# Patient Record
Sex: Male | Born: 1990 | Race: White | Hispanic: No | Marital: Single | Smoking: Never smoker
Health system: Southern US, Community
[De-identification: ages and names within clinical notes are randomized; demographics above are authoritative.]

## PROBLEM LIST (undated history)

## (undated) DIAGNOSIS — K5090 Crohn's disease, unspecified, without complications: Secondary | ICD-10-CM

## (undated) DIAGNOSIS — J45909 Unspecified asthma, uncomplicated: Secondary | ICD-10-CM

## (undated) HISTORY — DX: Crohn's disease, unspecified, without complications: K50.90

## (undated) HISTORY — PX: TONSILLECTOMY: SUR1361

---
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
## 2018-11-08 ENCOUNTER — Other Ambulatory Visit: Payer: Self-pay

## 2018-11-08 DIAGNOSIS — Z20822 Contact with and (suspected) exposure to covid-19: Secondary | ICD-10-CM

## 2018-11-09 LAB — NOVEL CORONAVIRUS, NAA: SARS-CoV-2, NAA: NOT DETECTED

## 2019-07-10 ENCOUNTER — Ambulatory Visit: Payer: Self-pay | Admitting: Orthopedic Surgery

## 2019-07-24 ENCOUNTER — Ambulatory Visit: Payer: Self-pay | Admitting: Orthopedic Surgery

## 2019-07-24 NOTE — H&P (Signed)
Subjective:   Roger [name] is a pleasant 29 year old male who was in his normal state of health until October began experiencing severe low back pain, eventually in addition to the severe low back pain, the patient also developed severe do debilitating radicular right leg pain despite conservative treatment including injection therapy, the patient continues to have significant pain interfering with his quality-of-life and he would like to move forward with surgical intervention. He denies any leg weakness or loss of bladder or bowel control. He is scheduled for Right L5-S1 Discectomy on 07/30/19 With Dr. Rolena Infante at [facility].  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 [employer] Chewing tobacco: none Alcohol intake: Occasional Hand Dominance: Right Work related injury?: N Advance directive: N Freight forwarder of Attorney: N Live alone or with others?: with others Marital status: Single  Surgical History Reviewed Surgical History Tonsillectomy  Reviewed Past Medical History ADD/ADHD: Y  Review of Systems As stated in HPI  Objective:   Vitals: Ht: 6 ft 2 in 07/24/2019 01:23 pm Wt: 295 lbs 07/24/2019 01:23 pm BMI: 37.9 07/24/2019 01:23 pm BP: 138/86 07/24/2019 01:30 pm Pulse: 67 bpm 07/24/2019 01:30 pm T: 98.3 F 07/24/2019 01:30 pm Pain Scale: 6 07/24/2019 01:28 pm  Clinical exam: Roger [name] is a pleasant individual, who appears younger than their stated age. He is alert and orientated 3. No shortness of breath, chest pain.  Heart: Regular rate and rhythm, no rubs, murmurs, or gallops  Lungs: Clear auscultation bilaterally  Abdomen is soft and non-tender, negative loss of bowel and  bladder control, no rebound tenderness. Bowel sounds 4.  Negative: skin lesions abrasions contusions  Peripheral pulses: 2+ dorsalis pedis/posterior tibialis pulses bilaterally. Compartment soft and nontender.  Gait pattern: Normal  Assistive devices: None  Neuro: 5/5 motor strength bilaterally in the lower extremity. Positive numbness and dysesthesias in the right S1 dermatome. Positive straight leg raise test on the right side, positive contralateral straight leg raise test. Negative Babinski test, 1+ deep tendon reflexes at the knee and Achilles. No clonus.  Musculoskeletal: Significant back pain with forward flexion. No severe pain with extension. No SI joint pain. No obvious deformity of the lumbar spine is noted on exam. No significant hip, knee, ankle pain with isolated joint range of motion.  Lumbar x-rays taken 06/15/19 no spondylolisthesis or scoliosis is noted. No significant collapse of the disc spaces is noted. No fracture seen.  Lumbar MRI: completed on 06/15/19 was reviewed with the patient. It was completed at [facility]; I have independently reviewed the images as well as the radiology report. No disc herniation or stenosis L1-5. No abnormal marrow signal change, no fracture. Mild disc degeneration at L5-S1 with moderate disc herniation with displacement of the descending S1 nerve root on the right side.  Noted temporary improvement in his right radicular leg pain following the right S1 selective nerve root block. However his overall quality-of-life hiss deteriorated. His clinical exam is essentially unchanged from 06/19/19.  Assessment:    Roger [name] is a very pleasant 29 year old gentleman with a 6 month history of significant back and radicular leg pain. Imaging studies confirm an L5-S1 right-sided disc herniation with right S1 neural compression. Despite conservative treatment consisting of a recent selective nerve root block and self-directed physical therapy his  quality-of-life his  continue to deteriorate. Clinical exam is consistent with lumbar radiculopathy with sensory deficits and positive nerve root tension signs.    Plan:   Patient is expressed a desire to move forward with a lumbar discectomy which I think is reasonable. We have gone over the surgical risks and benefits and all of his questions were encouraged and addressed.Risks and benefits of decompression/discectomy: Infection, bleeding, death, stroke, paralysis, ongoing or worse pain, need for additional surgery, leak of spinal fluid, adjacent segment degeneration requiring additional surgery, post-operative hematoma formation that can result in neurological compromise and the need for urgent/emergent re-operation. Loss in bowel and bladder control. Injury to major vessels that could result in the need for urgent abdominal surgery to stop bleeding. Risk of deep venous thrombosis (DVT) and the need for additional treatment. Recurrent disc herniation resulting in the need for revision surgery, which could include fusion surgery (utilizing instrumentation such as pedicle screws and intervertebral cages).  Additional risk: If instrumentation is used there is a risk of migration, or breakage of that hardware that could require additional surgery.  Goals of surgery: Reduction in pain, and improvement in quality of life.  We have also discussed the post-operative recovery period to include: bathing/showering restrictions, wound healing, activity (and driving) restrictions, medications/pain mangement.  We have also discussed post-operative redflags to include: signs and symptoms of postoperative infection, DVT/PE.  Patient is on the blood thinners or aspirin. He is not taking any over-the-counter medications.  Patient will be fitted for his LSO brace by PT today.  All patients questions were invited and answered.  Follow-up: 2 weeks postoperatively

## 2019-07-24 NOTE — H&P (Deleted)
  The note originally documented on this encounter has been moved the the encounter in which it belongs.  

## 2019-07-24 NOTE — Pre-Procedure Instructions (Signed)
[id number] [place], [place] - [address] [place] [address] Phone: [phone] Fax: [phone]      Your procedure is scheduled on Thursday April 22nd.  Report to [facility] Main Entrance "A" at 5:30 A.M., and check in at the Admitting office.  Call this number if you have problems the morning of surgery:  [phone]  Call [phone] if you have any questions prior to your surgery date Monday-Friday 8am-4pm    Remember:  Do not eat or drink after midnight the night before your surgery   Take these medicines the morning of surgery with A SIP OF WATER   NONE  As of today, STOP taking any Aspirin (unless otherwise instructed by your surgeon) and Aspirin containing products, Aleve, Naproxen, Ibuprofen, Motrin, Advil, Goody's, BC's, all herbal medications, fish oil, and all vitamins.                      Do not wear jewelry            Do not wear lotions, powders, colognes, or deodorant.            Do not shave 48 hours prior to surgery.  Men may shave face and neck.            Do not bring valuables to the hospital.            [facility] is not responsible for any belongings or valuables.  Do NOT Smoke (Tobacco/Vapping) or drink Alcohol 24 hours prior to your procedure If you use a CPAP at night, you may bring all equipment for your overnight stay.   Contacts, glasses, dentures or bridgework may not be worn into surgery.      For patients admitted to the hospital, discharge time will be determined by your treatment team.   Patients discharged the day of surgery will not be allowed to drive home, and someone needs to stay with them for 24 hours.    Special instructions:   [place]- Preparing For Surgery  Before surgery, you can play an important role. Because skin is not sterile, your skin needs to be as free of germs as possible. You can reduce the number of germs on your skin by washing with CHG  (chlorahexidine gluconate) Soap before surgery.  CHG is an antiseptic cleaner which kills germs and bonds with the skin to continue killing germs even after washing.    Oral Hygiene is also important to reduce your risk of infection.  Remember - BRUSH YOUR TEETH THE MORNING OF SURGERY WITH YOUR REGULAR TOOTHPASTE  Please do not use if you have an allergy to CHG or antibacterial soaps. If your skin becomes reddened/irritated stop using the CHG.  Do not shave (including legs and underarms) for at least 48 hours prior to first CHG shower. It is OK to shave your face.  Please follow these instructions carefully.   1. Shower the NIGHT BEFORE SURGERY and the MORNING OF SURGERY with CHG Soap.   2. If you chose to wash your hair, wash your hair first as usual with your normal shampoo.  3. After you shampoo, rinse your hair and body thoroughly to remove the shampoo.  4. Use CHG as you would any other liquid soap. You can apply CHG directly to the skin and wash gently with a scrungie or a clean washcloth.   5. Apply the CHG Soap to your  body ONLY FROM THE NECK DOWN.  Do not use on open wounds or open sores. Avoid contact with your eyes, ears, mouth and genitals (private parts). Wash Face and genitals (private parts)  with your normal soap.   6. Wash thoroughly, paying special attention to the area where your surgery will be performed.  7. Thoroughly rinse your body with warm water from the neck down.  8. DO NOT shower/wash with your normal soap after using and rinsing off the CHG Soap.  9. Pat yourself dry with a CLEAN TOWEL.  10. Wear CLEAN PAJAMAS to bed the night before surgery, wear comfortable clothes the morning of surgery  11. Place CLEAN SHEETS on your bed the night of your first shower and DO NOT SLEEP WITH PETS.   Day of Surgery:   Do not apply any deodorants/lotions.  Please wear clean clothes to the hospital/surgery center.   Remember to brush your teeth WITH YOUR REGULAR  TOOTHPASTE.   Please read over the following fact sheets that you were given.

## 2019-07-27 ENCOUNTER — Other Ambulatory Visit (HOSPITAL_COMMUNITY)
Admission: RE | Admit: 2019-07-27 | Discharge: 2019-07-27 | Disposition: A | Discharge status: Home / Self Care | Payer: 59 | Source: Ambulatory Visit | Attending: Orthopedic Surgery | Admitting: Orthopedic Surgery

## 2019-07-27 ENCOUNTER — Ambulatory Visit (HOSPITAL_COMMUNITY)
Admission: RE | Admit: 2019-07-27 | Discharge: 2019-07-27 | Disposition: A | Discharge status: Home / Self Care | Payer: 59 | Source: Ambulatory Visit | Attending: Orthopedic Surgery | Admitting: Orthopedic Surgery

## 2019-07-27 ENCOUNTER — Encounter (HOSPITAL_COMMUNITY): Payer: Self-pay

## 2019-07-27 ENCOUNTER — Other Ambulatory Visit: Payer: Self-pay

## 2019-07-27 ENCOUNTER — Encounter (HOSPITAL_COMMUNITY)
Admission: RE | Admit: 2019-07-27 | Discharge: 2019-07-27 | Disposition: A | Discharge status: Home / Self Care | Payer: 59 | Source: Ambulatory Visit | Attending: Orthopedic Surgery | Admitting: Orthopedic Surgery

## 2019-07-27 DIAGNOSIS — Z20822 Contact with and (suspected) exposure to covid-19: Secondary | ICD-10-CM | POA: Diagnosis not present

## 2019-07-27 DIAGNOSIS — Z01818 Encounter for other preprocedural examination: Secondary | ICD-10-CM

## 2019-07-27 HISTORY — DX: Unspecified asthma, uncomplicated: J45.909

## 2019-07-27 LAB — CBC
HCT: 44.1 % (ref 39.0–52.0)
Hemoglobin: 14.2 g/dL (ref 13.0–17.0)
MCH: 25.2 pg — ABNORMAL LOW (ref 26.0–34.0)
MCHC: 32.2 g/dL (ref 30.0–36.0)
MCV: 78.2 fL — ABNORMAL LOW (ref 80.0–100.0)
Platelets: 262 10*3/uL (ref 150–400)
RBC: 5.64 MIL/uL (ref 4.22–5.81)
RDW: 13.2 % (ref 11.5–15.5)
WBC: 7.3 10*3/uL (ref 4.0–10.5)
nRBC: 0 % (ref 0.0–0.2)

## 2019-07-27 LAB — URINALYSIS, ROUTINE W REFLEX MICROSCOPIC
Bilirubin Urine: NEGATIVE
Glucose, UA: NEGATIVE mg/dL
Hgb urine dipstick: NEGATIVE
Ketones, ur: NEGATIVE mg/dL
Leukocytes,Ua: NEGATIVE
Nitrite: NEGATIVE
Protein, ur: NEGATIVE mg/dL
Specific Gravity, Urine: 1.023 (ref 1.005–1.030)
pH: 5 (ref 5.0–8.0)

## 2019-07-27 LAB — BASIC METABOLIC PANEL
Anion gap: 11 (ref 5–15)
BUN: 14 mg/dL (ref 6–20)
CO2: 24 mmol/L (ref 22–32)
Calcium: 9.2 mg/dL (ref 8.9–10.3)
Chloride: 104 mmol/L (ref 98–111)
Creatinine, Ser: 0.89 mg/dL (ref 0.61–1.24)
GFR calc Af Amer: >60 mL/min (ref >60)
GFR calc non Af Amer: >60 mL/min (ref >60)
Glucose, Bld: 100 mg/dL — ABNORMAL HIGH (ref 70–99)
Potassium: 4.2 mmol/L (ref 3.5–5.1)
Sodium: 139 mmol/L (ref 135–145)

## 2019-07-27 LAB — APTT: aPTT: 33 seconds (ref 24–36)

## 2019-07-27 LAB — SURGICAL PCR SCREEN
MRSA, PCR: NEGATIVE
Staphylococcus aureus: POSITIVE — AB

## 2019-07-27 LAB — SARS CORONAVIRUS 2 (TAT 6-24 HRS): SARS Coronavirus 2: NEGATIVE

## 2019-07-27 LAB — PROTIME-INR
INR: 1 (ref 0.8–1.2)
Prothrombin Time: 12.8 seconds (ref 11.4–15.2)

## 2019-07-27 NOTE — Progress Notes (Signed)
PCP - denies Cardiologist -denies   PPM/ICD - denies  Chest x-ray - 07/27/2019 EKG - N/A Stress Test - denies ECHO - denies  Cardiac Cath - denies  Sleep Study - denies CPAP - N/A OSA Stop Bang Tool score: 6   DM: denies  Blood Thinner Instructions: N/A Aspirin Instructions: N/A  ERAS Protcol - No  COVID TEST- Scheduled for today 4/19/201 after PAT appointment. Patient verbalized understanding of self-quarantine instructions, appointment time and place.  Anesthesia review: YES, per MD orders  Patient denies shortness of breath, fever, cough and chest pain at PAT appointment  All instructions explained to the patient, with a verbal understanding of the material. Patient agrees to go over the instructions while at home for a better understanding. Patient also instructed to self quarantine after being tested for COVID-19. The opportunity to ask questions was provided.

## 2019-07-29 NOTE — Anesthesia Preprocedure Evaluation (Addendum)
Anesthesia Evaluation  Patient identified by MRN, date of birth, ID band Patient awake    Reviewed: Allergy & Precautions, H&P , NPO status , Patient's Chart, lab work & pertinent test results  Airway Mallampati: II  TM Distance: >3 FB Neck ROM: Full    Dental no notable dental hx. (+) Teeth Intact, Dental Advisory Given   Pulmonary neg pulmonary ROS,    Pulmonary exam normal breath sounds clear to auscultation       Cardiovascular Exercise Tolerance: Good negative cardio ROS Normal cardiovascular exam Rhythm:Regular Rate:Normal     Neuro/Psych negative neurological ROS  negative psych ROS   GI/Hepatic negative GI ROS, Neg liver ROS,   Endo/Other  negative endocrine ROS  Renal/GU negative Renal ROS  negative genitourinary   Musculoskeletal negative musculoskeletal ROS (+)   Abdominal   Peds negative pediatric ROS (+)  Hematology negative hematology ROS (+)   Anesthesia Other Findings   Reproductive/Obstetrics negative OB ROS                            Anesthesia Physical Anesthesia Plan  ASA: II  Anesthesia Plan: General   Post-op Pain Management:    Induction: Intravenous  PONV Risk Score and Plan: 2 and Ondansetron and Dexamethasone  Airway Management Planned: Oral ETT  Additional Equipment:   Intra-op Plan:   Post-operative Plan: Extubation in OR  Informed Consent: I have reviewed the patients History and Physical, chart, labs and discussed the procedure including the risks, benefits and alternatives for the proposed anesthesia with the patient or authorized representative who has indicated his/her understanding and acceptance.       Plan Discussed with: Anesthesiologist, CRNA and Surgeon  Anesthesia Plan Comments: (  )       Anesthesia Quick Evaluation

## 2019-07-30 ENCOUNTER — Ambulatory Visit (HOSPITAL_COMMUNITY): Payer: 59

## 2019-07-30 ENCOUNTER — Ambulatory Visit (HOSPITAL_COMMUNITY)
Admission: RE | Admit: 2019-07-30 | Discharge: 2019-07-30 | Disposition: A | Discharge status: Home / Self Care | Payer: 59 | Attending: Orthopedic Surgery | Admitting: Orthopedic Surgery

## 2019-07-30 ENCOUNTER — Encounter (HOSPITAL_COMMUNITY): Payer: Self-pay | Admitting: Orthopedic Surgery

## 2019-07-30 ENCOUNTER — Ambulatory Visit (HOSPITAL_COMMUNITY): Payer: 59 | Admitting: Physician Assistant

## 2019-07-30 ENCOUNTER — Other Ambulatory Visit: Payer: Self-pay

## 2019-07-30 ENCOUNTER — Ambulatory Visit (HOSPITAL_COMMUNITY): Payer: 59 | Admitting: Anesthesiology

## 2019-07-30 ENCOUNTER — Encounter (HOSPITAL_COMMUNITY)
Admission: RE | Disposition: A | Discharge status: Home / Self Care | Payer: Self-pay | Source: Home / Self Care | Attending: Orthopedic Surgery

## 2019-07-30 DIAGNOSIS — Z419 Encounter for procedure for purposes other than remedying health state, unspecified: Secondary | ICD-10-CM

## 2019-07-30 DIAGNOSIS — M5117 Intervertebral disc disorders with radiculopathy, lumbosacral region: Secondary | ICD-10-CM | POA: Diagnosis present

## 2019-07-30 HISTORY — PX: LUMBAR LAMINECTOMY/DECOMPRESSION MICRODISCECTOMY: SHX5026

## 2019-07-30 SURGERY — LUMBAR LAMINECTOMY/DECOMPRESSION MICRODISCECTOMY 1 LEVEL
Anesthesia: General | Laterality: Right

## 2019-07-30 MED ORDER — LIDOCAINE 2% (20 MG/ML) 5 ML SYRINGE
INTRAMUSCULAR | Status: AC
  Filled 2019-07-30: qty 5

## 2019-07-30 MED ORDER — ROCURONIUM BROMIDE 10 MG/ML (PF) SYRINGE
PREFILLED_SYRINGE | INTRAVENOUS | Status: AC
  Filled 2019-07-30: qty 10

## 2019-07-30 MED ORDER — VANCOMYCIN HCL 500 MG IV SOLR
INTRAVENOUS | Status: AC
  Filled 2019-07-30: qty 500

## 2019-07-30 MED ORDER — ONDANSETRON HCL 4 MG/2ML IJ SOLN
INTRAMUSCULAR | Status: AC
  Filled 2019-07-30: qty 2

## 2019-07-30 MED ORDER — THROMBIN 20000 UNITS EX SOLR: CUTANEOUS | Status: DC | PRN

## 2019-07-30 MED ORDER — FENTANYL CITRATE (PF) 100 MCG/2ML IJ SOLN
INTRAMUSCULAR | Status: AC
  Filled 2019-07-30: qty 2

## 2019-07-30 MED ORDER — FENTANYL CITRATE (PF) 100 MCG/2ML IJ SOLN
25.0000 ug | INTRAMUSCULAR | Status: DC | PRN
  Administered 2019-07-30 (×2): 50 ug via INTRAVENOUS

## 2019-07-30 MED ORDER — DEXAMETHASONE SODIUM PHOSPHATE 10 MG/ML IJ SOLN
INTRAMUSCULAR | Status: AC
  Filled 2019-07-30: qty 1

## 2019-07-30 MED ORDER — OXYCODONE HCL 5 MG PO TABS
5.0000 mg | ORAL_TABLET | Freq: Once | ORAL | Status: AC | PRN
  Administered 2019-07-30: 11:00:00 5 mg via ORAL

## 2019-07-30 MED ORDER — ACETAMINOPHEN 10 MG/ML IV SOLN
INTRAVENOUS | Status: AC
  Filled 2019-07-30: qty 100

## 2019-07-30 MED ORDER — ACETAMINOPHEN 10 MG/ML IV SOLN
INTRAVENOUS | Status: DC | PRN
  Administered 2019-07-30: 1000 mg via INTRAVENOUS

## 2019-07-30 MED ORDER — METHYLPREDNISOLONE ACETATE 40 MG/ML INJ SUSP (RADIOLOG
INTRAMUSCULAR | Status: DC | PRN
  Administered 2019-07-30: 40 mg

## 2019-07-30 MED ORDER — METHOCARBAMOL 500 MG PO TABS: 500.0000 mg | ORAL_TABLET | Freq: Three times a day (TID) | ORAL | 0 refills | Status: AC | PRN

## 2019-07-30 MED ORDER — VANCOMYCIN HCL 500 MG IV SOLR
INTRAVENOUS | Status: DC | PRN
  Administered 2019-07-30: 500 mg via TOPICAL

## 2019-07-30 MED ORDER — METHOCARBAMOL 500 MG PO TABS
ORAL_TABLET | ORAL | Status: AC
  Filled 2019-07-30: qty 1

## 2019-07-30 MED ORDER — OXYCODONE HCL 5 MG PO TABS: 5.0000 mg | ORAL_TABLET | ORAL | Status: DC | PRN

## 2019-07-30 MED ORDER — PROPOFOL 10 MG/ML IV BOLUS
INTRAVENOUS | Status: AC
  Filled 2019-07-30: qty 20

## 2019-07-30 MED ORDER — OXYCODONE HCL 5 MG PO TABS
ORAL_TABLET | ORAL | Status: AC
  Filled 2019-07-30: qty 1

## 2019-07-30 MED ORDER — TRANEXAMIC ACID-NACL 1000-0.7 MG/100ML-% IV SOLN
INTRAVENOUS | Status: AC
  Filled 2019-07-30: qty 100

## 2019-07-30 MED ORDER — EPINEPHRINE PF 1 MG/ML IJ SOLN
INTRAMUSCULAR | Status: AC
  Filled 2019-07-30: qty 1

## 2019-07-30 MED ORDER — ONDANSETRON HCL 4 MG PO TABS: 4.0000 mg | ORAL_TABLET | Freq: Three times a day (TID) | ORAL | 0 refills | Status: DC | PRN

## 2019-07-30 MED ORDER — 0.9 % SODIUM CHLORIDE (POUR BTL) OPTIME
TOPICAL | Status: DC | PRN
  Administered 2019-07-30 (×2): 1000 mL

## 2019-07-30 MED ORDER — TRANEXAMIC ACID-NACL 1000-0.7 MG/100ML-% IV SOLN
INTRAVENOUS | Status: DC | PRN
  Administered 2019-07-30: 1000 mg via INTRAVENOUS

## 2019-07-30 MED ORDER — PHENYLEPHRINE 40 MCG/ML (10ML) SYRINGE FOR IV PUSH (FOR BLOOD PRESSURE SUPPORT)
PREFILLED_SYRINGE | INTRAVENOUS | Status: AC
  Filled 2019-07-30: qty 10

## 2019-07-30 MED ORDER — METHOCARBAMOL 500 MG PO TABS
500.0000 mg | ORAL_TABLET | Freq: Four times a day (QID) | ORAL | Status: DC | PRN
  Administered 2019-07-30: 500 mg via ORAL
  Filled 2019-07-30 (×2): qty 1

## 2019-07-30 MED ORDER — LIDOCAINE 2% (20 MG/ML) 5 ML SYRINGE
INTRAMUSCULAR | Status: DC | PRN
  Administered 2019-07-30: 100 mg via INTRAVENOUS

## 2019-07-30 MED ORDER — LACTATED RINGERS IV SOLN: INTRAVENOUS | Status: DC | PRN

## 2019-07-30 MED ORDER — DEXAMETHASONE SODIUM PHOSPHATE 10 MG/ML IJ SOLN
INTRAMUSCULAR | Status: DC | PRN
  Administered 2019-07-30: 10 mg via INTRAVENOUS

## 2019-07-30 MED ORDER — EPINEPHRINE PF 1 MG/ML IJ SOLN
INTRAMUSCULAR | Status: DC | PRN
  Administered 2019-07-30: .15 mL

## 2019-07-30 MED ORDER — DEXMEDETOMIDINE HCL 200 MCG/2ML IV SOLN
INTRAVENOUS | Status: DC | PRN
  Administered 2019-07-30: 40 ug via INTRAVENOUS
  Administered 2019-07-30: 12 ug via INTRAVENOUS

## 2019-07-30 MED ORDER — SUGAMMADEX SODIUM 200 MG/2ML IV SOLN
INTRAVENOUS | Status: DC | PRN
  Administered 2019-07-30: 400 mg via INTRAVENOUS

## 2019-07-30 MED ORDER — MIDAZOLAM HCL 5 MG/5ML IJ SOLN
INTRAMUSCULAR | Status: DC | PRN
  Administered 2019-07-30: 2 mg via INTRAVENOUS

## 2019-07-30 MED ORDER — CEFAZOLIN SODIUM-DEXTROSE 2-4 GM/100ML-% IV SOLN
2.0000 g | INTRAVENOUS | Status: AC
  Administered 2019-07-30: 3 g via INTRAVENOUS
  Filled 2019-07-30: qty 100

## 2019-07-30 MED ORDER — MEPERIDINE HCL 25 MG/ML IJ SOLN: 6.2500 mg | INTRAMUSCULAR | Status: DC | PRN

## 2019-07-30 MED ORDER — OXYCODONE HCL 5 MG PO TABS: 10.0000 mg | ORAL_TABLET | ORAL | Status: DC | PRN

## 2019-07-30 MED ORDER — BUPIVACAINE HCL (PF) 0.25 % IJ SOLN
INTRAMUSCULAR | Status: DC | PRN
  Administered 2019-07-30: 30 mL

## 2019-07-30 MED ORDER — THROMBIN 20000 UNITS EX KIT
PACK | CUTANEOUS | Status: AC
  Filled 2019-07-30: qty 1

## 2019-07-30 MED ORDER — GLYCOPYRROLATE PF 0.2 MG/ML IJ SOSY
PREFILLED_SYRINGE | INTRAMUSCULAR | Status: DC | PRN
  Administered 2019-07-30: .1 mg via INTRAVENOUS

## 2019-07-30 MED ORDER — METHOCARBAMOL 1000 MG/10ML IJ SOLN
500.0000 mg | Freq: Four times a day (QID) | INTRAVENOUS | Status: DC | PRN
  Filled 2019-07-30: qty 5

## 2019-07-30 MED ORDER — BUPIVACAINE HCL (PF) 0.25 % IJ SOLN
INTRAMUSCULAR | Status: AC
  Filled 2019-07-30: qty 30

## 2019-07-30 MED ORDER — MIDAZOLAM HCL 2 MG/2ML IJ SOLN
INTRAMUSCULAR | Status: AC
  Filled 2019-07-30: qty 2

## 2019-07-30 MED ORDER — FENTANYL CITRATE (PF) 250 MCG/5ML IJ SOLN
INTRAMUSCULAR | Status: AC
  Filled 2019-07-30: qty 5

## 2019-07-30 MED ORDER — OXYCODONE HCL 5 MG/5ML PO SOLN: 5.0000 mg | Freq: Once | ORAL | Status: AC | PRN

## 2019-07-30 MED ORDER — DEXAMETHASONE SODIUM PHOSPHATE 10 MG/ML IJ SOLN: INTRAMUSCULAR | Status: DC | PRN

## 2019-07-30 MED ORDER — ONDANSETRON HCL 4 MG/2ML IJ SOLN: 4.0000 mg | Freq: Once | INTRAMUSCULAR | Status: DC | PRN

## 2019-07-30 MED ORDER — ACETAMINOPHEN 160 MG/5ML PO SOLN: 325.0000 mg | ORAL | Status: DC | PRN

## 2019-07-30 MED ORDER — PROPOFOL 10 MG/ML IV BOLUS
INTRAVENOUS | Status: DC | PRN
  Administered 2019-07-30: 200 mg via INTRAVENOUS

## 2019-07-30 MED ORDER — ROCURONIUM BROMIDE 50 MG/5ML IV SOSY
PREFILLED_SYRINGE | INTRAVENOUS | Status: DC | PRN
  Administered 2019-07-30: 70 mg via INTRAVENOUS
  Administered 2019-07-30 (×2): 20 mg via INTRAVENOUS
  Administered 2019-07-30: 10 mg via INTRAVENOUS

## 2019-07-30 MED ORDER — ACETAMINOPHEN 325 MG PO TABS: 325.0000 mg | ORAL_TABLET | ORAL | Status: DC | PRN

## 2019-07-30 MED ORDER — GLYCOPYRROLATE PF 0.2 MG/ML IJ SOSY
PREFILLED_SYRINGE | INTRAMUSCULAR | Status: AC
  Filled 2019-07-30: qty 1

## 2019-07-30 MED ORDER — ONDANSETRON HCL 4 MG/2ML IJ SOLN
INTRAMUSCULAR | Status: DC | PRN
  Administered 2019-07-30: 4 mg via INTRAVENOUS

## 2019-07-30 MED ORDER — CEFAZOLIN SODIUM 1 G IJ SOLR
INTRAMUSCULAR | Status: AC
  Filled 2019-07-30: qty 10

## 2019-07-30 MED ORDER — OXYCODONE-ACETAMINOPHEN 10-325 MG PO TABS: 1.0000 | ORAL_TABLET | Freq: Four times a day (QID) | ORAL | 0 refills | Status: AC | PRN

## 2019-07-30 MED ORDER — METHYLPREDNISOLONE ACETATE 40 MG/ML IJ SUSP
INTRAMUSCULAR | Status: AC
  Filled 2019-07-30: qty 1

## 2019-07-30 MED ORDER — FENTANYL CITRATE (PF) 100 MCG/2ML IJ SOLN
INTRAMUSCULAR | Status: DC | PRN
  Administered 2019-07-30: 50 ug via INTRAVENOUS
  Administered 2019-07-30: 150 ug via INTRAVENOUS
  Administered 2019-07-30: 50 ug via INTRAVENOUS

## 2019-07-30 SURGICAL SUPPLY — 60 items
BNDG GAUZE ELAST 4 BULKY (GAUZE/BANDAGES/DRESSINGS) ×3 IMPLANT
BUR EGG ELITE 4.0 (BURR) IMPLANT
BUR EGG ELITE 4.0MM (BURR)
BUR MATCHSTICK NEURO 3.0 LAGG (BURR) IMPLANT
CANISTER SUCT 3000ML PPV (MISCELLANEOUS) ×3 IMPLANT
CLOSURE STERI-STRIP 1/2X4 (GAUZE/BANDAGES/DRESSINGS) ×1
CLSR STERI-STRIP ANTIMIC 1/2X4 (GAUZE/BANDAGES/DRESSINGS) ×2 IMPLANT
CORD BIPOLAR FORCEPS 12FT (ELECTRODE) ×3 IMPLANT
COVER SURGICAL LIGHT HANDLE (MISCELLANEOUS) ×3 IMPLANT
COVER WAND RF STERILE (DRAPES) ×3 IMPLANT
DRAIN CHANNEL 15F RND FF W/TCR (WOUND CARE) IMPLANT
DRAPE POUCH INSTRU U-SHP 10X18 (DRAPES) ×3 IMPLANT
DRAPE SURG 17X23 STRL (DRAPES) ×3 IMPLANT
DRAPE U-SHAPE 47X51 STRL (DRAPES) ×3 IMPLANT
DRSG OPSITE POSTOP 3X4 (GAUZE/BANDAGES/DRESSINGS) ×3 IMPLANT
DRSG OPSITE POSTOP 4X6 (GAUZE/BANDAGES/DRESSINGS) ×3 IMPLANT
DURAPREP 26ML APPLICATOR (WOUND CARE) ×3 IMPLANT
ELECT BLADE 4.0 EZ CLEAN MEGAD (MISCELLANEOUS)
ELECT CAUTERY BLADE 6.4 (BLADE) ×3 IMPLANT
ELECT PENCIL ROCKER SW 15FT (MISCELLANEOUS) ×3 IMPLANT
ELECT REM PT RETURN 9FT ADLT (ELECTROSURGICAL) ×3
ELECTRODE BLDE 4.0 EZ CLN MEGD (MISCELLANEOUS) IMPLANT
ELECTRODE REM PT RTRN 9FT ADLT (ELECTROSURGICAL) ×1 IMPLANT
EVACUATOR SILICONE 100CC (DRAIN) IMPLANT
GLOVE BIO SURGEON STRL SZ 6.5 (GLOVE) ×2 IMPLANT
GLOVE BIO SURGEONS STRL SZ 6.5 (GLOVE) ×1
GLOVE BIOGEL PI IND STRL 6.5 (GLOVE) ×1 IMPLANT
GLOVE BIOGEL PI IND STRL 8.5 (GLOVE) ×1 IMPLANT
GLOVE BIOGEL PI INDICATOR 6.5 (GLOVE) ×2
GLOVE BIOGEL PI INDICATOR 8.5 (GLOVE) ×2
GLOVE SS BIOGEL STRL SZ 8.5 (GLOVE) ×1 IMPLANT
GLOVE SUPERSENSE BIOGEL SZ 8.5 (GLOVE) ×2
GOWN STRL REUS W/ TWL LRG LVL3 (GOWN DISPOSABLE) ×2 IMPLANT
GOWN STRL REUS W/TWL 2XL LVL3 (GOWN DISPOSABLE) ×3 IMPLANT
GOWN STRL REUS W/TWL LRG LVL3 (GOWN DISPOSABLE) ×4
KIT BASIN OR (CUSTOM PROCEDURE TRAY) ×3 IMPLANT
KIT TURNOVER KIT B (KITS) ×3 IMPLANT
NEEDLE 22X1 1/2 (OR ONLY) (NEEDLE) ×3 IMPLANT
NEEDLE SPNL 18GX3.5 QUINCKE PK (NEEDLE) ×6 IMPLANT
NS IRRIG 1000ML POUR BTL (IV SOLUTION) ×3 IMPLANT
PACK LAMINECTOMY ORTHO (CUSTOM PROCEDURE TRAY) ×3 IMPLANT
PACK UNIVERSAL I (CUSTOM PROCEDURE TRAY) ×3 IMPLANT
PAD ARMBOARD 7.5X6 YLW CONV (MISCELLANEOUS) ×6 IMPLANT
PATTIES SURGICAL .5 X.5 (GAUZE/BANDAGES/DRESSINGS) ×3 IMPLANT
PATTIES SURGICAL .5 X1 (DISPOSABLE) ×3 IMPLANT
SPONGE SURGIFOAM ABS GEL 100 (HEMOSTASIS) IMPLANT
SURGIFLO W/THROMBIN 8M KIT (HEMOSTASIS) ×3 IMPLANT
SUT BONE WAX W31G (SUTURE) ×3 IMPLANT
SUT MNCRL AB 3-0 PS2 27 (SUTURE) ×3 IMPLANT
SUT VIC AB 0 CT1 27 (SUTURE)
SUT VIC AB 0 CT1 27XBRD ANBCTR (SUTURE) IMPLANT
SUT VIC AB 1 CT1 18XCR BRD 8 (SUTURE) ×1 IMPLANT
SUT VIC AB 1 CT1 8-18 (SUTURE) ×2
SUT VIC AB 2-0 CT1 18 (SUTURE) ×3 IMPLANT
SYR BULB IRRIGATION 50ML (SYRINGE) ×3 IMPLANT
SYR CONTROL 10ML LL (SYRINGE) ×3 IMPLANT
TOWEL GREEN STERILE (TOWEL DISPOSABLE) ×3 IMPLANT
TOWEL GREEN STERILE FF (TOWEL DISPOSABLE) ×3 IMPLANT
WATER STERILE IRR 1000ML POUR (IV SOLUTION) ×3 IMPLANT
YANKAUER SUCT BULB TIP NO VENT (SUCTIONS) IMPLANT

## 2019-07-30 NOTE — Op Note (Signed)
Operative report  Preoperative diagnosis: Right L5-S1 disc herniation with right S1 radiculopathy  Postoperative diagnosis: Same  Operative procedure: Right L5-S1 discectomy for decompression of S1 nerve root with medial facetectomy  1st Assistant: Cleta Alberts, PA  CPT code: [phone]  Indications: Roger [name] is a very pleasant 29 year old gentleman with significant back and radicular right leg pain.  Clinical exam and imaging studies confirmed a posterior lateral to the right disc herniation with compression of the S1 nerve root.  Because of the radicular pain and loss in quality of life we elected to move forward with surgery.  All appropriate risks benefits and alternatives were discussed with the patient and consent was obtained.  Operative report: Patient was brought the operating placed upon the operating table.  After successful induction of general anesthesia and endotracheal intubation teds SCDs were applied.  The back was then prepped and draped in standard fashion.  Timeout was taken to confirm patient procedure and all other important data.  Two needles were then placed in the back and an intraoperative lateral x-ray was taken for localization of skin incision.  I marked out the incision site and infiltrated with quarter percent Marcaine with epinephrine.  Midline incision was made and sharp dissection was carried out down through the adipose tissue to the deep fascia.  I incised the deep fascia on the right-hand side and began using a Cobb elevator and Bovie to stripped the paraspinal muscles to expose the S1 and L5 and a portion of the L4 spinous process and lamina.  Once I had the L5 lamina completely exposed I placed a Penfield four underneath the L5 lamina.  A 2nd x-ray was taken confirming that I was at the appropriate level.  Using a 3 mm Kerrison rongeur I performed a laminotomy of L5.  I then used a Penfield four to dissect through the ligamentum flavum and I resected this with the two  and 3 mm Kerrison punch.  I began dissecting into the lateral recess and I developed a plane between the thecal sac and the medial aspect of the facet.  Using a 2 mm Kerrison rongeur a medial facetectomy was performed.  At this point I now could clearly visualize the S1 nerve root.  There is significant bruising and compression to the nerve noted.  I gently began manipulating and maneuvering the nerve and the disc herniation came into view.  Once I had the nerve root protected with a neuro patty and the thecal sac superiorly protected with a neuro patty I then performed an annulotomy with a 15 blade scalpel.  Using the nerve hooks I mobilized the disc fragment and removed three large fragments of disc material.  This was consistent with what was seen on the preoperative MRI.  I then used the Epstein curette to debride some of the osteophyte that had formed at the level of the disc space.  At this point the S1 nerve root was no longer under compression and was freely mobile.  I did trace the S1 nerve root just to the entrance of the neuroforamen ensuring had adequate decompression.  I irrigated the wound copiously normal saline and used bipolar electrocautery and FloSeal to obtain hemostasis.  After final irrigation I checked the wound one more time.  I could easily pass my Woodson elevator superiorly inferiorly along the lateral recess and out the S1 foramen.  The S1 nerve root was freely mobile and I could pass my [facility] underneath the thecal sac centrally over the central  portion of the disc herniation.  I had adequately decompressed the area and the disc fragments were removed were consistent with the MRI findings.  One last irrigation was done and then I did place a thrombin-soaked Gelfoam patty over the laminotomy defect.  Because of the obesity and size of the incision I elected to place him vancomycin powder to decrease the potential risk of an infection.  I closed the deep fascia with interrupted  #1 Vicryl suture, then a 0 running Vicryl suture, then 2-0 Vicryl suture, and finally 3-0 Monocryl for the skin.  Steri-Strips and dry dressings were applied and the patient was ultimately extubated transfer the PACU without incident.  At the end of the case all needle sponge counts were correct.  There were no adverse intraoperative events.

## 2019-07-30 NOTE — OR Nursing (Signed)
Dr Denice Paradise called stating metallic markers is placed at L5-S1. Dr Rolena Infante made aware.

## 2019-07-30 NOTE — Brief Op Note (Signed)
07/30/2019  9:40 AM  PATIENT:  Roger [name]  29 y.o. male  PRE-OPERATIVE DIAGNOSIS:  Right L5-S1 herniated disc  POST-OPERATIVE DIAGNOSIS:  Right L5-S1 herniated disc  PROCEDURE:  Procedure(s) with comments: Right L5-S1disectomy (Right) - 2.5hrs  SURGEON:  Surgeon(s) and Role:    Melina Schools, MD - Primary  PHYSICIAN ASSISTANT:   ASSISTANTS: Amanda Ward, PA   ANESTHESIA:   general  EBL:  50 mL   BLOOD ADMINISTERED:none  DRAINS: none   LOCAL MEDICATIONS USED:  MARCAINE    and OTHER DepoMedrol.  Vancomycin powder  SPECIMEN:  No Specimen  DISPOSITION OF SPECIMEN:  N/A  COUNTS:  YES  TOURNIQUET:  * No tourniquets in log *  DICTATION: .Dragon Dictation  PLAN OF CARE: Admit for overnight observation  PATIENT DISPOSITION:  PACU - hemodynamically stable.

## 2019-07-30 NOTE — H&P (Signed)
Addendum H&P: There is been no change in the patient's clinical exam since his last visit of 07/24/2019.  Continues to have severe radicular right leg pain in the S1 dermatome.  Surgical plan is right L5-S1 discectomy.  I have gone over the surgical procedure as well as the risks and benefits and all of his questions were encouraged and addressed.

## 2019-07-30 NOTE — Transfer of Care (Signed)
Immediate Anesthesia Transfer of Care Note  Patient: Roger [name]  Procedure(s) Performed: Right L5-S1disectomy (Right )  Patient Location: PACU  Anesthesia Type:General  Level of Consciousness: awake, oriented and patient cooperative  Airway & Oxygen Therapy: Patient Spontanous Breathing and Patient connected to face mask oxygen  Post-op Assessment: Report given to RN, Post -op Vital signs reviewed and stable and Patient moving all extremities X 4  Post vital signs: Reviewed and stable  Last Vitals:  Vitals Value Taken Time  BP    Temp    Pulse 97 07/30/19 0959  Resp 16 07/30/19 0959  SpO2 96 % 07/30/19 0959  Vitals shown include unvalidated device data.  Last Pain:  Vitals:   07/30/19 [id number]  TempSrc:   PainSc: 2       Patients Stated Pain Goal: 2 ([id number])  Complications: No apparent anesthesia complications

## 2019-07-30 NOTE — Anesthesia Procedure Notes (Signed)
Procedure Name: Intubation Date/Time: 07/30/2019 7:41 AM Performed by: Orlie Dakin, CRNA Pre-anesthesia Checklist: Patient identified, Emergency Drugs available, Suction available and Patient being monitored Patient Re-evaluated:Patient Re-evaluated prior to induction Oxygen Delivery Method: Circle system utilized Preoxygenation: Pre-oxygenation with 100% oxygen Induction Type: IV induction Ventilation: Mask ventilation without difficulty Laryngoscope Size: Miller and 3 Grade View: Grade I Tube type: Oral Tube size: 7.5 mm Number of attempts: 1 Airway Equipment and Method: Stylet Placement Confirmation: ETT inserted through vocal cords under direct vision,  positive ETCO2 and breath sounds checked- equal and bilateral Secured at: 23 cm Tube secured with: Tape Dental Injury: Teeth and Oropharynx as per pre-operative assessment  Comments: 4x4s bite block used.

## 2019-07-30 NOTE — Discharge Instructions (Signed)

## 2019-07-31 MED FILL — Thrombin For Soln Kit 20000 Unit: CUTANEOUS | Qty: 1 | Status: AC

## 2019-07-31 NOTE — Anesthesia Postprocedure Evaluation (Signed)
Anesthesia Post Note  Patient: Roger [name]  Procedure(s) Performed: Right L5-S1disectomy (Right )     Patient location during evaluation: PACU Anesthesia Type: General Level of consciousness: awake and alert Pain management: pain level controlled Vital Signs Assessment: post-procedure vital signs reviewed and stable Respiratory status: spontaneous breathing, nonlabored ventilation, respiratory function stable and patient connected to nasal cannula oxygen Cardiovascular status: blood pressure returned to baseline and stable Postop Assessment: no apparent nausea or vomiting Anesthetic complications: no    Last Vitals:  Vitals:   07/30/19 1029 07/30/19 1044  BP: 130/73   Pulse: 94   Resp: 15   Temp:  36.5 C  SpO2: 94%     Last Pain:  Vitals:   07/30/19 1029  TempSrc:   PainSc: 4                  Abdulahad Mederos

## 2019-07-31 NOTE — Anesthesia Postprocedure Evaluation (Signed)
Anesthesia Post Note  Patient: Roger [name]  Procedure(s) Performed: Right L5-S1disectomy (Right )     Anesthesia Type: General    Last Vitals:  Vitals:   07/30/19 1029 07/30/19 1044  BP: 130/73   Pulse: 94   Resp: 15   Temp:  36.5 C  SpO2: 94%     Last Pain:  Vitals:   07/30/19 1029  TempSrc:   PainSc: 4                  Jess Sulak

## 2021-08-09 ENCOUNTER — Telehealth: Payer: Self-pay | Admitting: Physician Assistant

## 2021-08-09 ENCOUNTER — Ambulatory Visit: Payer: 59 | Admitting: Physician Assistant

## 2021-08-09 NOTE — Telephone Encounter (Signed)
Good Morning Anderson Malta, ? ? ?Patients wife called stating she wanted to cancel patients appointment with you today at 3:00 due to changing his mind and went to see his PCP instead. ?

## 2021-08-21 ENCOUNTER — Encounter: Payer: Self-pay | Admitting: Physician Assistant

## 2021-09-11 ENCOUNTER — Encounter: Payer: Self-pay | Admitting: Physician Assistant

## 2021-09-11 ENCOUNTER — Other Ambulatory Visit (INDEPENDENT_AMBULATORY_CARE_PROVIDER_SITE_OTHER): Payer: 59

## 2021-09-11 ENCOUNTER — Ambulatory Visit: Payer: 59 | Admitting: Physician Assistant

## 2021-09-11 VITALS — BP 124/76 | HR 87 | Ht 74.0 in | Wt 278.2 lb

## 2021-09-11 DIAGNOSIS — K611 Rectal abscess: Secondary | ICD-10-CM

## 2021-09-11 DIAGNOSIS — K921 Melena: Secondary | ICD-10-CM

## 2021-09-11 DIAGNOSIS — R197 Diarrhea, unspecified: Secondary | ICD-10-CM | POA: Diagnosis not present

## 2021-09-11 DIAGNOSIS — R1084 Generalized abdominal pain: Secondary | ICD-10-CM

## 2021-09-11 LAB — CBC WITH DIFFERENTIAL/PLATELET
Basophils Absolute: 0.1 10*3/uL (ref 0.0–0.1)
Basophils Relative: 0.6 % (ref 0.0–3.0)
Eosinophils Absolute: 0.2 10*3/uL (ref 0.0–0.7)
Eosinophils Relative: 1.9 % (ref 0.0–5.0)
HCT: 39.5 % (ref 39.0–52.0)
Hemoglobin: 12.9 g/dL — ABNORMAL LOW (ref 13.0–17.0)
Lymphocytes Relative: 13.6 % (ref 12.0–46.0)
Lymphs Abs: 1.4 10*3/uL (ref 0.7–4.0)
MCHC: 32.7 g/dL (ref 30.0–36.0)
MCV: 72.6 fl — ABNORMAL LOW (ref 78.0–100.0)
Monocytes Absolute: 0.9 10*3/uL (ref 0.1–1.0)
Monocytes Relative: 9 % (ref 3.0–12.0)
Neutro Abs: 7.8 10*3/uL — ABNORMAL HIGH (ref 1.4–7.7)
Neutrophils Relative %: 74.9 % (ref 43.0–77.0)
Platelets: 299 10*3/uL (ref 150.0–400.0)
RBC: 5.44 Mil/uL (ref 4.22–5.81)
RDW: 15 % (ref 11.5–15.5)
WBC: 10.4 10*3/uL (ref 4.0–10.5)

## 2021-09-11 LAB — COMPREHENSIVE METABOLIC PANEL
ALT: 32 U/L (ref 0–53)
AST: 23 U/L (ref 0–37)
Albumin: 4.2 g/dL (ref 3.5–5.2)
Alkaline Phosphatase: 96 U/L (ref 39–117)
BUN: 15 mg/dL (ref 6–23)
CO2: 27 mEq/L (ref 19–32)
Calcium: 9.5 mg/dL (ref 8.4–10.5)
Chloride: 103 mEq/L (ref 96–112)
Creatinine, Ser: 1.01 mg/dL (ref 0.40–1.50)
GFR: 100.42 mL/min (ref >60.00)
Glucose, Bld: 93 mg/dL (ref 70–99)
Potassium: 4.2 mEq/L (ref 3.5–5.1)
Sodium: 138 mEq/L (ref 135–145)
Total Bilirubin: 0.6 mg/dL (ref 0.2–1.2)
Total Protein: 7.5 g/dL (ref 6.0–8.3)

## 2021-09-11 LAB — IBC + FERRITIN
Ferritin: 28.6 ng/mL (ref 22.0–322.0)
Iron: 28 ug/dL — ABNORMAL LOW (ref 42–165)
Saturation Ratios: 9 % — ABNORMAL LOW (ref 20.0–50.0)
TIBC: 312.2 ug/dL (ref 250.0–450.0)
Transferrin: 223 mg/dL (ref 212.0–360.0)

## 2021-09-11 LAB — SEDIMENTATION RATE: Sed Rate: 52 mm/hr — ABNORMAL HIGH (ref 0–15)

## 2021-09-11 MED ORDER — CIPROFLOXACIN HCL 500 MG PO TABS: 500.0000 mg | ORAL_TABLET | Freq: Two times a day (BID) | ORAL | 0 refills | Status: DC

## 2021-09-11 MED ORDER — NA SULFATE-K SULFATE-MG SULF 17.5-3.13-1.6 GM/177ML PO SOLN: 1.0000 | Freq: Once | ORAL | 0 refills | Status: AC

## 2021-09-11 NOTE — Progress Notes (Signed)
Chief Complaint: Abdominal pain, diarrhea, hematochezia and rectal pain  HPI:    Roger [name] is a 31 year old Caucasian male with a past medical history as listed below, who was referred to me by Margy Clarks, NP for a complaint of abdominal pain, diarrhea, hematochezia and rectal pain.    08/14/2021 patient saw PCP and described "an infection in my butt", he was establishing care at that visit and described that he was seen in urgent care the week before for abdominal pain and rectal pain.  Apparently had it ever since he was a child.  He was given Bactrim and hyoscyamine and felt like the Bactrim helped for a while.  He was continued on Hyoscyamine and referred to our clinic.  CMP that day with an elevated glucose at 101 and a normal hemoglobin.  Otherwise normal CBC and CMP.    Today, the patient tells me that ever since he was a young child he has had issues with abdominal pain and diarrhea.  Over the past 4 years he has 95% diarrheal stools and maybe a solid stool once a month.  Along with this has been seeing blood with his stool every time that he defecates which is 2-3 times a day.  Prior to defecating he has intense cramping sensation which does feel some better after he goes to the bathroom, but then he has increased rectal pain.  Tells me he was given an antibiotic which seemed to help for about a week but then all of this came back.  Describes it has been diagnosed with "abscesses before".  No prior pelvic imaging or colonoscopy.  Associated symptoms include occasional nausea.  He does not smoke.  Also occasional fever and night sweats.  Also describes some secretion of mucus/purulent material oftentimes in his stool.    Patient is going on a trip to the beach in a week for a week and then will be back in the area.    Denies vomiting, family history of IBD, heartburn or reflux.  Past Medical History:  Diagnosis Date   Asthma    07/27/2019: per patient "as a kid"    Past Surgical History:   Procedure Laterality Date   LUMBAR LAMINECTOMY/DECOMPRESSION MICRODISCECTOMY Right 07/30/2019   Procedure: Right L5-S1disectomy;  Surgeon: Melina Schools, MD;  Location: [facility];  Service: Orthopedics;  Laterality: Right;  2.5hrs   TONSILLECTOMY      Current Outpatient Medications  Medication Sig Dispense Refill   Na Sulfate-K Sulfate-Mg Sulf 17.5-3.13-1.6 GM/177ML SOLN Take 1 kit by mouth once for 1 dose. 354 mL 0   No current facility-administered medications for this visit.    Allergies as of 09/11/2021   (No Known Allergies)    Family History  Problem Relation Age of Onset   Colon cancer Neg Hx    Stomach cancer Neg Hx    Esophageal cancer Neg Hx    Colon polyps Neg Hx     Social History   Socioeconomic History   Marital status: Single    Spouse name: Not on file   Number of children: 2   Years of education: Not on file   Highest education level: Not on file  Occupational History   Not on file  Tobacco Use   Smoking status: Never   Smokeless tobacco: Former    Types: Chew    Quit date: 04/28/2019  Vaping Use   Vaping Use: Never used  Substance and Sexual Activity   Alcohol use: Yes  Comment: 07/27/2019: occassionally    Drug use: Never   Sexual activity: Not on file  Other Topics Concern   Not on file  Social History Narrative   Not on file   Social Determinants of Health   Financial Resource Strain: Not on file  Food Insecurity: Not on file  Transportation Needs: Not on file  Physical Activity: Not on file  Stress: Not on file  Social Connections: Not on file  Intimate Partner Violence: Not on file    Review of Systems:    Constitutional: No weight loss or chills Skin: No rash Cardiovascular: No chest pain Respiratory: No SOB Gastrointestinal: See HPI and otherwise negative Genitourinary: No dysuria  Neurological: No headache, dizziness or syncope Musculoskeletal: No new muscle or joint pain Hematologic: No bruising Psychiatric: No history  of depression or anxiety   Physical Exam:  Vital signs: BP 124/76   Pulse 87   Ht 6' 2"  (1.88 m)   Wt 278 lb 3.2 oz (126.2 kg)   SpO2 98%   BMI 35.72 kg/m   Constitutional:   Pleasant overweight Caucasian male appears to be in NAD, Well developed, Well nourished, alert and cooperative Head:  Normocephalic and atraumatic. Eyes:   PEERL, EOMI. No icterus. Conjunctiva pink. Ears:  Normal auditory acuity. Neck:  Supple Throat: Oral cavity and pharynx without inflammation, swelling or lesion.  Respiratory: Respirations even and unlabored. Lungs clear to auscultation bilaterally.   No wheezes, crackles, or rhonchi.  Cardiovascular: Normal S1, S2. No MRG. Regular rate and rhythm. No peripheral edema, cyanosis or pallor.  Gastrointestinal:  Soft, nondistended, mild to moderate generalized TTP. No rebound or guarding. Normal bowel sounds. No appreciable masses or hepatomegaly. Rectal: External: Fluctuant area on right buttock as well as left buttock with visible purulent material from the anal canal, internal exam very tender to palpation which was limiting and anoscopy not pursued due to discomfort Msk:  Symmetrical without gross deformities. Without edema, no deformity or joint abnormality.  Neurologic:  Alert and  oriented x4;  grossly normal neurologically.  Skin:   Dry and intact without significant lesions or rashes. Psychiatric: Demonstrates good judgement and reason without abnormal affect or behaviors.  See HPI for recent labs.  Assessment: 1.  Perirectal abscess: Treated 1 time about a month ago which helped for about a week, visible abscess on exam today would likely fistulous tracking given discharge from rectum 2.  Diarrhea, abdominal pain and hematochezia: With nausea and fever at night and night sweats, for at least 4 to 5 years, worsened recently, with above; most likely IBD  Plan: 1.  Ordered stat MRI of the pelvis for further evaluation of perirectal abscess. 2.  Referred  the patient stat to our surgical team, [facility]. 3.  Ordered labs to include CBC, CMP, iron studies and ESR/CRP 4.  Patient will be scheduled for colonoscopy with Dr. Havery Moros in the next couple of weeks, did provide the patient a detailed list of risks for the procedure and he agrees to proceed. 5.  Prescribed Cipro 500 mg twice daily and Flagyl 500 mg 3 times daily x10 days. 6.  Patient to follow in clinic per recommendations after work-up above.  He was established with Dr. Havery Moros today.  Ellouise Newer, PA-C [facility] 09/11/2021, 9:24 AM  Cc: Margy Clarks, NP

## 2021-09-11 NOTE — Progress Notes (Signed)
Agree with assessment and plan as outlined.  Clinical suspicion for perirectal abscess and underlying IBD. Have started antibiotics. MRI pelvis ordered to evaluate for fistula / abscess and referral to colorectal surgery ASAP, followed by colonoscopy once he has recovered.  Anderson Malta can you clarify when the patient will be scheduled to see surgery - hopefully later today or tomorrow? Thanks

## 2021-09-11 NOTE — Patient Instructions (Signed)
You have been scheduled for an MRI/Pelvis at [facility]  on 09/12/2021. Your appointment time is 12 pm. Please arrive to admitting (at main entrance of the hospital) 15 minutes prior to your appointment time for registration purposes. Please make certain not to have anything to eat or drink 6 hours prior to your test. In addition, if you have any metal in your body, have a pacemaker or defibrillator, please be sure to let your ordering physician know. This test typically takes 45 minutes to 1 hour to complete. Should you need to reschedule, please call [phone] to do so.   We have referred you to [facility] to see Geryl Rankins. They will contact you with that appointment   You have been scheduled for a colonoscopy. Please follow written instructions given to you at your visit today.  Please pick up your prep supplies at the pharmacy within the next 1-3 days. If you use inhalers (even only as needed), please bring them with you on the day of your procedure.   Your provider has requested that you go to the basement level for lab work before leaving today. Press "B" on the elevator. The lab is located at the first door on the left as you exit the elevator.   We have sent the following medications to your pharmacy for you to pick up at your convenience:  Cipro  Flagyl   Follow up per recommendations after procedures  Due to recent changes in healthcare laws, you may see the results of your imaging and laboratory studies on MyChart before your provider has had a chance to review them.  We understand that in some cases there may be results that are confusing or concerning to you. Not all laboratory results come back in the same time frame and the provider may be waiting for multiple results in order to interpret others.  Please give [place] 48 hours in order for your provider to thoroughly review all the results before contacting the office for clarification of your results.    If you are age 73 or  older, your body mass index should be between 23-30. Your Body mass index is 35.72 kg/m. If this is out of the aforementioned range listed, please consider follow up with your Primary Care Provider.  If you are age 69 or younger, your body mass index should be between 19-25. Your Body mass index is 35.72 kg/m. If this is out of the aformentioned range listed, please consider follow up with your Primary Care Provider.   ________________________________________________________  The [facility] providers would like to encourage you to use [facility] to communicate with providers for non-urgent requests or questions.  Due to long hold times on the telephone, sending your provider a message by [facility] may be a faster and more efficient way to get a response.  Please allow 48 business hours for a response.  Please remember that this is for non-urgent requests.  _______________________________________________________   I appreciate the  opportunity to care for you  Thank You   Lovett Calender

## 2021-09-12 ENCOUNTER — Ambulatory Visit (HOSPITAL_COMMUNITY)
Admission: RE | Admit: 2021-09-12 | Discharge: 2021-09-12 | Disposition: A | Discharge status: Home / Self Care | Payer: 59 | Source: Ambulatory Visit | Attending: Physician Assistant | Admitting: Physician Assistant

## 2021-09-12 DIAGNOSIS — R197 Diarrhea, unspecified: Secondary | ICD-10-CM | POA: Diagnosis present

## 2021-09-12 DIAGNOSIS — K611 Rectal abscess: Secondary | ICD-10-CM | POA: Insufficient documentation

## 2021-09-12 DIAGNOSIS — R1084 Generalized abdominal pain: Secondary | ICD-10-CM | POA: Insufficient documentation

## 2021-09-12 DIAGNOSIS — K921 Melena: Secondary | ICD-10-CM | POA: Insufficient documentation

## 2021-09-12 LAB — HIGH SENSITIVITY CRP: CRP, High Sensitivity: 21 mg/L — ABNORMAL HIGH (ref 0.000–5.000)

## 2021-09-12 MED ORDER — GADOBUTROL 1 MMOL/ML IV SOLN
10.0000 mL | Freq: Once | INTRAVENOUS | Status: AC | PRN
  Administered 2021-09-12: 10 mL via INTRAVENOUS

## 2021-09-13 ENCOUNTER — Telehealth: Payer: Self-pay | Admitting: *Deleted

## 2021-09-13 ENCOUNTER — Other Ambulatory Visit: Payer: Self-pay

## 2021-09-13 MED ORDER — FERROUS SULFATE 325 (65 FE) MG PO TBEC: 325.0000 mg | DELAYED_RELEASE_TABLET | Freq: Every day | ORAL | 0 refills | Status: AC

## 2021-09-13 NOTE — Telephone Encounter (Signed)
Thanks Shirlean Mylar,  Can you clarify tomorrow if they can see him? If not, can you check and see how the patient is feeling with the antibiotics, to make sure he is stable to wait over the weekend? If he is worsening he may need to go to the hospital if they can't see him tomorrow. Thanks

## 2021-09-13 NOTE — Telephone Encounter (Signed)
Agree with assessment and plan as outlined.  Clinical suspicion for perirectal abscess and underlying IBD. Have started antibiotics. MRI pelvis ordered to evaluate for fistula / abscess and referral to colorectal surgery ASAP, followed by colonoscopy once he has recovered.   Roger [name] can you clarify when the patient will be scheduled to see surgery - hopefully later today or tomorrow? Thanks     Patient needs referral for perirectal abcess put referral in on the 5th. I will call [facility] now   I called [facility] they are going to call the patient to see if he can be seen tomorrow if they can work him in. . Referral coordinator is working with the nurse  and getting him registered

## 2021-09-14 NOTE — Telephone Encounter (Signed)
[name], Roger Raspberry, MD sent to Oda Kilts, CMA Caller: Unspecified (Yesterday,  4:26 PM) Yes that's fine if they had an opening. Who is it with? If they did not have an opening and were trying to work him in otherwise it could wait another few weeks, nonurgent. Thanks    I told them today is was not an emergency but they had already scheduled him with a PA for Friday

## 2021-09-14 NOTE — Telephone Encounter (Signed)
Thanks. 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 Colorectal surgery consult is not urgent at this time, can be seen within a few weeks if you can let them know. He should take his antibiotics and I will see him for colonoscopy soon. Thanks

## 2021-09-14 NOTE — Telephone Encounter (Signed)
Looks like [facility]CP is back the results from yesterday

## 2021-09-14 NOTE — Telephone Encounter (Signed)
Dr Havery Moros, Patient is scheduled at [facility] for 6/9. Do you want him to keep this appointment?

## 2021-09-22 ENCOUNTER — Encounter: Payer: Self-pay | Admitting: Gastroenterology

## 2021-09-28 ENCOUNTER — Ambulatory Visit (AMBULATORY_SURGERY_CENTER): Payer: 59 | Admitting: Gastroenterology

## 2021-09-28 ENCOUNTER — Other Ambulatory Visit: Payer: 59

## 2021-09-28 ENCOUNTER — Encounter: Payer: Self-pay | Admitting: Gastroenterology

## 2021-09-28 VITALS — BP 127/72 | HR 84 | Temp 98.9°F | Resp 12 | Ht 74.0 in | Wt 278.0 lb

## 2021-09-28 DIAGNOSIS — K50113 Crohn's disease of large intestine with fistula: Secondary | ICD-10-CM | POA: Diagnosis not present

## 2021-09-28 DIAGNOSIS — K5140 Inflammatory polyps of colon without complications: Secondary | ICD-10-CM

## 2021-09-28 DIAGNOSIS — K603 Anal fistula: Secondary | ICD-10-CM

## 2021-09-28 DIAGNOSIS — K529 Noninfective gastroenteritis and colitis, unspecified: Secondary | ICD-10-CM

## 2021-09-28 DIAGNOSIS — K50118 Crohn's disease of large intestine with other complication: Secondary | ICD-10-CM | POA: Diagnosis not present

## 2021-09-28 DIAGNOSIS — D128 Benign neoplasm of rectum: Secondary | ICD-10-CM

## 2021-09-28 DIAGNOSIS — K921 Melena: Secondary | ICD-10-CM

## 2021-09-28 DIAGNOSIS — K5150 Left sided colitis without complications: Secondary | ICD-10-CM

## 2021-09-28 MED ORDER — SODIUM CHLORIDE 0.9 % IV SOLN: 500.0000 mL | Freq: Once | INTRAVENOUS | Status: DC

## 2021-09-28 NOTE — Patient Instructions (Signed)
YOU HAD AN ENDOSCOPIC PROCEDURE TODAY AT [facility]:   Refer to the procedure report that was given to you for any specific questions about what was found during the examination.  If the procedure report does not answer your questions, please call your gastroenterologist to clarify.  If you requested that your care partner not be given the details of your procedure findings, then the procedure report has been included in a sealed envelope for you to review at your convenience later.  YOU SHOULD EXPECT: Some feelings of bloating in the abdomen. Passage of more gas than usual.  Walking can help get rid of the air that was put into your GI tract during the procedure and reduce the bloating. If you had a lower endoscopy (such as a colonoscopy or flexible sigmoidoscopy) you may notice spotting of blood in your stool or on the toilet paper. If you underwent a bowel prep for your procedure, you may not have a normal bowel movement for a few days.  Please Note:  You might notice some irritation and congestion in your nose or some drainage.  This is from the oxygen used during your procedure.  There is no need for concern and it should clear up in a day or so.  SYMPTOMS TO REPORT IMMEDIATELY:  Following lower endoscopy (colonoscopy or flexible sigmoidoscopy):  Excessive amounts of blood in the stool  Significant tenderness or worsening of abdominal pains  Swelling of the abdomen that is new, acute  Fever of 100F or higher  For urgent or emergent issues, a gastroenterologist can be reached at any hour by calling [phone]. Do not use MyChart messaging for urgent concerns.    DIET:  We do recommend a small meal at first, but then you may proceed to your regular diet.  Drink plenty of fluids but you should avoid alcoholic beverages for 24 hours.  MEDICATIONS: Continue present medications.  Please see handouts given to you by your recovery nurse.  FOLLOW UP: Go to the lab on your  way out today to have blood drawn.  Thank you for allowing [place] to provide for your healthcare needs today.  ACTIVITY:  You should plan to take it easy for the rest of today and you should NOT DRIVE or use heavy machinery until tomorrow (because of the sedation medicines used during the test).    FOLLOW UP: Our staff will call the number listed on your records 24-72 hours following your procedure to check on you and address any questions or concerns that you may have regarding the information given to you following your procedure. If we do not reach you, we will leave a message.  We will attempt to reach you two times.  During this call, we will ask if you have developed any symptoms of COVID 19. If you develop any symptoms (ie: fever, flu-like symptoms, shortness of breath, cough etc.) before then, please call [phone].  If you test positive for Covid 19 in the 2 weeks post procedure, please call and report this information to [place].    If any biopsies were taken you will be contacted by phone or by letter within the next 1-3 weeks.  Please call us at [phone] if you have not heard about the biopsies in 3 weeks.    SIGNATURES/CONFIDENTIALITY: You and/or your care partner have signed paperwork which will be entered into your electronic medical record.  These signatures attest to the fact that that the information above on your  After Visit Summary has been reviewed and is understood.  Full responsibility of the confidentiality of this discharge information lies with you and/or your care-partner.

## 2021-09-28 NOTE — Progress Notes (Signed)
Pt reported he last time taking antibiotic was 09/20/21.  He did not complete antibiotic d/t he reported it caused him to be constipated and caused more rectal pain.  maw

## 2021-09-28 NOTE — Op Note (Addendum)
[facility] Patient Name: Roger [name] Procedure Date: 09/28/2021 2:33 PM MRN: [id number] Endoscopist: Remo Lipps P. Havery Moros , MD Age: 31 Referring MD:  Date of Birth: 1991/03/03 Gender: Male Account #: [id number] Procedure:                Colonoscopy Indications:              Chronic diarrhea, Rectal bleeding, perianal fistula                            - iron deficiency, clinical concern for underlying                            Crohn's disease Medicines:                Monitored Anesthesia Care Procedure:                Pre-Anesthesia Assessment:                           - Prior to the procedure, a History and Physical                            was performed, and patient medications and                            allergies were reviewed. The patient's tolerance of                            previous anesthesia was also reviewed. The risks                            and benefits of the procedure and the sedation                            options and risks were discussed with the patient.                            All questions were answered, and informed consent                            was obtained. Prior Anticoagulants: The patient has                            taken no previous anticoagulant or antiplatelet                            agents. ASA Grade Assessment: I - A normal, healthy                            patient. After reviewing the risks and benefits,                            the patient was deemed in satisfactory condition to  undergo the procedure.                           After obtaining informed consent, the colonoscope                            was passed under direct vision. Throughout the                            procedure, the patient's blood pressure, pulse, and                            oxygen saturations were monitored continuously. The                            Olympus CF-HQ190L [id number]) Colonoscope was                             introduced through the anus and advanced to the the                            terminal ileum, with identification of the                            appendiceal orifice and IC valve. The colonoscopy                            was performed without difficulty. The patient                            tolerated the procedure well. The quality of the                            bowel preparation was good. The terminal ileum,                            ileocecal valve, appendiceal orifice, and rectum                            were photographed. Scope In: 2:55:04 PM Scope Out: 3:11:37 PM Scope Withdrawal Time: 0 hours 11 minutes 59 seconds  Total Procedure Duration: 0 hours 16 minutes 33 seconds  Findings:                 The perianal and digital rectal examinations were                            normal.                           The terminal ileum appeared normal.                           A diminutive polyp was found in the rectum. The  polyp was sessile. The polyp was removed with a                            cold biopsy forceps. Resection and retrieval were                            complete.                           Patchy mild inflammation characterized by erosions,                            erythema, granularity and loss of vascularity was                            found in the distal rectum, in the sigmoid colon,                            in the descending colon, at the splenic flexure, in                            the distal transverse colon and in the cecum. Most                            affected area was mid sigmoid colon. Biopsies were                            taken with a cold forceps for histology from the                            right, transverse, and left colon.                           Internal hemorrhoids were found during                            retroflexion. The hemorrhoids were small.                            The exam was otherwise without abnormality. Complications:            No immediate complications. Estimated blood loss:                            Minimal. Estimated Blood Loss:     Estimated blood loss was minimal. Impression:               - The examined portion of the ileum was normal.                           - One diminutive polyp in the rectum, removed with                            a cold biopsy forceps. Resected and retrieved.                           -  Patchy mild inflammation was found in the distal                            rectum, in the sigmoid colon, in the descending                            colon, at the splenic flexure, in the distal                            transverse colon and in the cecum. Biopsied.                           - Internal hemorrhoids.                           - The examination was otherwise normal.                           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 Recommendation:           - Patient has a contact number available for                            emergencies. The signs and symptoms of potential                            delayed complications were discussed with the                            patient. Return to normal activities tomorrow.                            Written discharge instructions were provided to the                            patient.                           - Resume previous diet.                           - Continue present medications.                           - Await pathology results.                           - Go to the lab for: quantiferon gold, hepatitis B  testing, TPMT enzyme assay - prepare to start                            biologic therapy for treatment. Specifically, will                            recommend Remicade + thiopurines given perianal                             fistulas. Will discuss options with the patient Carlota Raspberry. Jasiya Markie, MD 09/28/2021 3:18:25 PM This report has been signed electronically.

## 2021-09-28 NOTE — Progress Notes (Signed)
Called to room to assist during endoscopic procedure.  Patient ID and intended procedure confirmed with present staff. Received instructions for my participation in the procedure from the performing physician.  

## 2021-09-28 NOTE — Progress Notes (Signed)
VSS, transported to PACU °

## 2021-09-29 ENCOUNTER — Telehealth: Payer: Self-pay | Admitting: *Deleted

## 2021-10-01 LAB — QUANTIFERON-TB GOLD PLUS
Mitogen-NIL: 8.29 IU/mL
NIL: 0.03 IU/mL
QuantiFERON-TB Gold Plus: NEGATIVE
TB1-NIL: 0.04 IU/mL
TB2-NIL: 0.05 IU/mL

## 2021-10-02 ENCOUNTER — Telehealth: Payer: Self-pay | Admitting: Gastroenterology

## 2021-10-02 MED ORDER — LIDOCAINE (ANORECTAL) 5 % EX CREA: 1.0000 | TOPICAL_CREAM | CUTANEOUS | 0 refills | Status: AC

## 2021-10-02 MED ORDER — DILTIAZEM GEL 2 %
CUTANEOUS | 0 refills | Status: AC
Start: 2021-10-02 — End: ?

## 2021-10-02 NOTE — Telephone Encounter (Signed)
Patients wife called stating that he needs a different letter for work or else he is going to lose is job and also that they called [facility] and [facility] directed him back to [place]. Patients wife is requesting a call back to discuss. Please advise.

## 2021-10-02 NOTE — Telephone Encounter (Signed)
Patient's wife called stating patient is still in extreme pain from his fissures.  So far, he has missed over two weeks of work and they are afraid he will lose his job as he cannot work.  He tried the cream but it is really not working for him.  It had started healing up somewhat, but the colonoscopy just made it worse.  He wants to know a plan of action and what he should be doing.  He is also requesting some type of work note so that he doesn't get fired from his job.  Please call patient or his wife and advise.  Thank you.

## 2021-10-02 NOTE — Telephone Encounter (Signed)
Returned call to patient's wife. Nifedipine is not helping. Pt's wife would like RX for Diltiazem called in, she is aware that this will have to be filled at [facility]. Pt's wife states that she has been giving the pt Preparation H cream with Lidocaine. I have advised pt's wife that pt should also try RectiCare OTC PRN for pain. Pt's wife wanted to know what pt should do since he is not getting better. Pt had a consult with [facility] on 09/15/21 and was advised to follow up after colonoscopy. I told pt's wife that he is scheduled for a follow up appt with Dr. Maisie Fus at [facility] on 10/07/20 at 11:20 am. I was told that pt was not aware of his follow up appt at [facility]. Pt's wife also stated that pt has missed work off and on for several weeks and has no vacation time left. Pt's wife states that he has access to his MyChart and they will access the letter there. Pt's wife verbalized understanding of all information and had no concerns at the end of the call.   Diltiazem 2% cream called into [facility]. Gave Brooke a Industrial/product designer order for [employer].

## 2021-10-03 MED ORDER — METRONIDAZOLE 250 MG PO TABS: 250.0000 mg | ORAL_TABLET | Freq: Three times a day (TID) | ORAL | 0 refills | Status: AC

## 2021-10-03 NOTE — Telephone Encounter (Addendum)
Called and spoke with [facility] regarding Dr. Lanetta Inch recommendations as outlined below. Kaleen Odea knows to expect a call from Dr. Adela Lank in a few days regarding pt's pathology results and plan moving forward. Kaleen Odea wants to know if pt is going to need surgery. I informed her that the surgeon would determine if pt is surgical candidate. I told her that based on the last surgical note they wanted pt to f/u after his colonoscopy and path results returned. Pt has an appt with Dr. Maisie Fus on 10/16/21. I told Kaleen Odea that we are sending in Flagyl for him to start taking for the next 2 weeks to see if that will help improve his symptoms in the interim. Kaleen Odea confirmed pharmacy on file. I advised Kaleen Odea that the pt may need to initiate FMLA due to missing a significant amount of work. She states that pt has short term disability but was saving that in case he needs to have surgery. I advised Kaleen Odea to discuss purpose of surgical referral with Dr. Adela Lank when he calls. Kaleen Odea is aware that I will send the patient a work note to his MyChart. She states that pt works a weird schedule, and is on some days and off the others. I told Kaleen Odea that pt can return to work on 10/09/21. Breanna verbalized understanding of all information and had no concerns at the end of the call.  Flagyl RX sent to [facility] pharmacy on file.   Work letter sent to patient via [employer].

## 2021-10-04 ENCOUNTER — Encounter: Payer: Self-pay | Admitting: Gastroenterology

## 2021-10-04 ENCOUNTER — Telehealth: Payer: Self-pay | Admitting: Gastroenterology

## 2021-10-04 ENCOUNTER — Other Ambulatory Visit: Payer: Self-pay | Admitting: Gastroenterology

## 2021-10-04 DIAGNOSIS — K5090 Crohn's disease, unspecified, without complications: Secondary | ICD-10-CM | POA: Insufficient documentation

## 2021-10-04 DIAGNOSIS — K50113 Crohn's disease of large intestine with fistula: Secondary | ICD-10-CM

## 2021-10-04 NOTE — Telephone Encounter (Signed)
Roger [name] called regarding path results.

## 2021-10-04 NOTE — Telephone Encounter (Signed)
See pathology result from recent colonoscopy for full details.  I called the patient and his wife about the results.

## 2021-10-05 ENCOUNTER — Telehealth: Payer: Self-pay | Admitting: Pharmacy Technician

## 2021-10-05 ENCOUNTER — Other Ambulatory Visit: Payer: Self-pay | Admitting: Pharmacy Technician

## 2021-10-05 NOTE — Telephone Encounter (Signed)
Thanks very much for your help!

## 2021-10-05 NOTE — Telephone Encounter (Addendum)
Dr. Havery Moros,  Remicade is the non-preferred medication for [facility] insurance and will likely be denied due to patient has not tried and or failed step therpy. Avsola Inflectra Would you like to try Avsola?    Auth Submission: APPROVED Payer: [employer] Medication & CPT/J Code(s) submitted: Avsola (infliximab-axxq) [id number] Route of submission (phone, fax, portal): PORTAL Auth type: Buy/Bill Units/visits requested: '10MG'$ /KG Reference number: [id number] Approval from: 10/02/21 to 04/06/22   AVSOLA CO-PAY CARD: APPROVED [id number] PCN: [id number] BIN: [id number] GR; [id number]  DEBIT CARD ID: [id number] JFH: 545 EXP: 01/06/26 PHONE: [phone] Claims can be uploaded [email or web address]

## 2021-10-05 NOTE — Telephone Encounter (Addendum)
Dr. Havery Moros,  Patient has been notified in regards of [place] and that it is the preferred medication for [facility]. Patient is aware he has been approved for the Avsola co-pay card and to bring debit card at his next appt.

## 2021-10-05 NOTE — Telephone Encounter (Signed)
Yes, Avsola is fine if covered by insurance. Can you please let the patient know this is biosimilar to Remicade and equivalent, just so they understand? Thanks

## 2021-10-06 LAB — THIOPURINE METHYLTRANSFERASE (TPMT), RBC: Thiopurine Methyltransferase, RBC: 14 nmol/hr/mL RBC

## 2021-10-06 LAB — HEPATITIS B CORE ANTIBODY, TOTAL: Hep B Core Total Ab: NONREACTIVE

## 2021-10-06 LAB — HEPATITIS B SURFACE ANTIGEN: Hepatitis B Surface Ag: NONREACTIVE

## 2021-10-09 ENCOUNTER — Other Ambulatory Visit: Payer: Self-pay

## 2021-10-09 DIAGNOSIS — K603 Anal fistula: Secondary | ICD-10-CM

## 2021-10-09 DIAGNOSIS — K50113 Crohn's disease of large intestine with fistula: Secondary | ICD-10-CM

## 2021-10-09 MED ORDER — AZATHIOPRINE 100 MG PO TABS: 250.0000 mg | ORAL_TABLET | Freq: Every day | ORAL | 0 refills | Status: DC

## 2021-10-12 ENCOUNTER — Ambulatory Visit (INDEPENDENT_AMBULATORY_CARE_PROVIDER_SITE_OTHER): Payer: 59

## 2021-10-12 VITALS — BP 119/72 | HR 66 | Temp 97.4°F | Resp 16 | Ht 75.0 in | Wt 280.2 lb

## 2021-10-12 DIAGNOSIS — K50113 Crohn's disease of large intestine with fistula: Secondary | ICD-10-CM | POA: Diagnosis not present

## 2021-10-12 MED ORDER — DIPHENHYDRAMINE HCL 25 MG PO CAPS
25.0000 mg | ORAL_CAPSULE | Freq: Once | ORAL | Status: AC
  Administered 2021-10-12: 25 mg via ORAL
  Filled 2021-10-12: qty 1

## 2021-10-12 MED ORDER — SODIUM CHLORIDE 0.9 % IV SOLN
5.0000 mg/kg | Freq: Once | INTRAVENOUS | Status: AC
  Administered 2021-10-12: 600 mg via INTRAVENOUS
  Filled 2021-10-12: qty 60

## 2021-10-12 MED ORDER — METHYLPREDNISOLONE SODIUM SUCC 40 MG IJ SOLR
40.0000 mg | Freq: Once | INTRAMUSCULAR | Status: AC
  Administered 2021-10-12: 40 mg via INTRAVENOUS
  Filled 2021-10-12: qty 1

## 2021-10-12 MED ORDER — ACETAMINOPHEN 325 MG PO TABS
650.0000 mg | ORAL_TABLET | Freq: Once | ORAL | Status: AC
  Administered 2021-10-12: 650 mg via ORAL
  Filled 2021-10-12: qty 2

## 2021-10-12 NOTE — Progress Notes (Signed)
Diagnosis: Crohn's Disease  Provider:  Marshell Garfinkel, MD  Procedure: Infusion  IV Type: Peripheral, IV Location: L Antecubital  Avsola (Infliximab-axxq), Dose: '600mg'$   Infusion Start Time: 1108  Infusion Stop Time: 6184  Post Infusion IV Care: Observation period completed  Discharge: Condition: Good, Destination: Home . AVS provided to patient.   Performed by:  Paul Dykes, RN

## 2021-10-26 ENCOUNTER — Ambulatory Visit (INDEPENDENT_AMBULATORY_CARE_PROVIDER_SITE_OTHER): Payer: 59

## 2021-10-26 ENCOUNTER — Other Ambulatory Visit: Payer: Self-pay | Admitting: Pharmacy Technician

## 2021-10-26 VITALS — BP 111/70 | HR 57 | Temp 98.7°F | Resp 16 | Ht 75.0 in | Wt 283.0 lb

## 2021-10-26 DIAGNOSIS — K50113 Crohn's disease of large intestine with fistula: Secondary | ICD-10-CM

## 2021-10-26 MED ORDER — DIPHENHYDRAMINE HCL 25 MG PO CAPS
25.0000 mg | ORAL_CAPSULE | Freq: Once | ORAL | Status: AC
  Administered 2021-10-26: 25 mg via ORAL
  Filled 2021-10-26: qty 1

## 2021-10-26 MED ORDER — METHYLPREDNISOLONE SODIUM SUCC 40 MG IJ SOLR
40.0000 mg | Freq: Once | INTRAMUSCULAR | Status: AC
  Administered 2021-10-26: 40 mg via INTRAVENOUS
  Filled 2021-10-26: qty 1

## 2021-10-26 MED ORDER — SODIUM CHLORIDE 0.9 % IV SOLN
5.0000 mg/kg | Freq: Once | INTRAVENOUS | Status: AC
  Administered 2021-10-26: 600 mg via INTRAVENOUS
  Filled 2021-10-26: qty 60

## 2021-10-26 MED ORDER — ACETAMINOPHEN 325 MG PO TABS
650.0000 mg | ORAL_TABLET | Freq: Once | ORAL | Status: AC
  Administered 2021-10-26: 650 mg via ORAL
  Filled 2021-10-26: qty 2

## 2021-10-26 NOTE — Progress Notes (Signed)
Diagnosis: Crohn's Disease  Provider:  Marshell Garfinkel, MD  Procedure: Infusion  IV Type: Peripheral, IV Location: L Hand  Avsola (Infliximab-axxq), Dose: '600mg'$   Infusion Start Time: 2637  Infusion Stop Time: 8588  Post Infusion IV Care: Peripheral IV Discontinued  Discharge: Condition: Good, Destination: Home . AVS provided to patient.   Performed by:  Adelina Mings, LPN

## 2021-11-08 ENCOUNTER — Ambulatory Visit: Payer: 59 | Admitting: Gastroenterology

## 2021-11-08 NOTE — Progress Notes (Deleted)
HPI :  31 year old Caucasian male with a past medical history as listed below, who was referred to me by Margy Clarks, NP for a complaint of abdominal pain, diarrhea, hematochezia and rectal pain.    08/14/2021 patient saw PCP and described "an infection in my butt", he was establishing care at that visit and described that he was seen in urgent care the week before for abdominal pain and rectal pain.  Apparently had it ever since he was a child.  He was given Bactrim and hyoscyamine and felt like the Bactrim helped for a while.  He was continued on Hyoscyamine and referred to our clinic.  CMP that day with an elevated glucose at 101 and a normal hemoglobin.  Otherwise normal CBC and CMP.    Today, the patient tells me that ever since he was a young child he has had issues with abdominal pain and diarrhea.  Over the past 4 years he has 95% diarrheal stools and maybe a solid stool once a month.  Along with this has been seeing blood with his stool every time that he defecates which is 2-3 times a day.  Prior to defecating he has intense cramping sensation which does feel some better after he goes to the bathroom, but then he has increased rectal pain.  Tells me he was given an antibiotic which seemed to help for about a week but then all of this came back.  Describes it has been diagnosed with "abscesses before".  No prior pelvic imaging or colonoscopy.  Associated symptoms include occasional nausea.  He does not smoke.  Also occasional fever and night sweats.  Also describes some secretion of mucus/purulent material oftentimes in his stool.    Patient is going on a trip to the beach in a week for a week and then will be back in the area.    Denies vomiting, family history of IBD, heartburn or reflux.   Assessment: 1.  Perirectal abscess: Treated 1 time about a month ago which helped for about a week, visible abscess on exam today would likely fistulous tracking given discharge from rectum 2.  Diarrhea,  abdominal pain and hematochezia: With nausea and fever at night and night sweats, for at least 4 to 5 years, worsened recently, with above; most likely IBD   Plan: 1.  Ordered stat MRI of the pelvis for further evaluation of perirectal abscess. 2.  Referred the patient stat to our surgical team, [facility]. 3.  Ordered labs to include CBC, CMP, iron studies and ESR/CRP 4.  Patient will be scheduled for colonoscopy with Dr. Havery Moros in the next couple of weeks, did provide the patient a detailed list of risks for the procedure and he agrees to proceed. 5.  Prescribed Cipro 500 mg twice daily and Flagyl 500 mg 3 times daily x10 days. 6.  Patient to follow in clinic per recommendations after work-up above.  He was established with Dr. Havery Moros today.    MRI pelvis 09/12/21: IMPRESSION: 1. 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 One of these appears to be blind-ending in the other to extend towards and possibly reach the gluteal cleft. 2. Enlarged perirectal lymph nodes. Mildly enlarged left external iliac node. These may be reactive. 3. Mild presacral edema. 4. No drainable abscess observed.   Colonoscopy 09/28/21: The perianal and digital rectal examinations were normal. - The terminal ileum appeared normal. - A diminutive polyp was found in the rectum. The polyp was sessile. The polyp was removed with  a cold biopsy forceps. Resection and retrieval were complete. - Patchy mild inflammation characterized by erosions, erythema, granularity and loss of vascularity was found in the distal rectum, in the sigmoid colon, in the descending colon, at the splenic flexure, in the distal transverse colon and in the cecum. Most affected area was mid sigmoid colon. Biopsies were taken with a cold forceps for histology from the right, transverse, and left colon. - Internal hemorrhoids were found during retroflexion. The hemorrhoids were small. - The exam was otherwise without  abnormality.  Diagnosis 1. Surgical [P], right colon - UNREMARKABLE COLONIC MUCOSA. - NO ACTIVE INFLAMMATION OR CHRONIC CHANGES. - NEGATIVE FOR DYSPLASIA. 2. Surgical [P], colon, transverse - MILDLY ACTIVE CHRONIC COLITIS WITH GRANULOMAS CONSISTENT WITH CROHN'S. - NEGATIVE FOR DYSPLASIA. 3. Surgical [P], left colon biopsy - FOCAL ACTIVE COLITIS WITH FOCAL EROSION. - NEGATIVE FOR DYSPLASIA. 4. Surgical [P], colon, rectum, polyp (1) - INFLAMMATORY POLYP. - NEGATIVE FOR DYSPLASIA.   The patient called back and I had a conversation with the patient and his wife earlier this afternoon about the pathology results and his diagnosis.  He has Crohn's disease that is affecting his colon and causing perianal fistulas.  We discussed what this is, and treatment options. I reviewed a variety of treatment options with them, given his perianal disease with fistulas, I am recommending aggressive medical therapy.  Of all the options available now, I believe Remicade plus thiopurine has the best chance to treat his fistulas and heal his colon.  I discussed these therapies as well as others, risks benefits.  Risks of Remicade and thiopurine use include infection, hepatotoxicity, bone marrow suppression, pancreatitis, neurologic disorders, skin manifestations, increased risk for lymphoma over time, etc.  Orson Ape would be another reasonable option here for first-line therapy however will be difficult to get approved by insurance and not as much data for perianal disease.  He is not a candidate for oral Rinvoq because he has not yet failed anti-TNF.   After discussion about these issues, management plan, long-term course, they want to proceed.  I do recommend adding a thiopurine to the Remicade to help reduce risk for immunogenicity, we discussed risk as above and they agreed to this.  We will send request to his insurance for starting Remicade and see if this is approved.  His QuantiFERON gold and hepatitis B testing  is negative.  Once his TPMT enzyme assay comes back, if normal will plan on starting a thiopurine as well.   I otherwise started him on some Flagyl for some perianal discomfort he has had, concern for abscess.  He is seeing colorectal surgery today.  Dr. Dema Severin faxed me his note and they plan on placing some setons to treat the fistulas.   I will see the patient back in the office in the next 1 to 2 months for reassessment.  I will contact him to start thiopurine's when his labs return.  They agree with the plan, all questions answered.   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 WDema Severin   Needs f/u labs Needs vaccinations   Past Medical History:  Diagnosis Date   Asthma    07/27/2019: per patient "as a kid"     Past Surgical History:  Procedure Laterality Date   LUMBAR LAMINECTOMY/DECOMPRESSION MICRODISCECTOMY Right 07/30/2019   Procedure: Right L5-S1disectomy;  Surgeon: BMelina Schools MD;  Location: [facility]  Service: Orthopedics;  Laterality: Right;  2.5hrs   TONSILLECTOMY     Family History  Problem Relation Age of Onset   Colon cancer Neg Hx    Stomach cancer Neg Hx    Esophageal cancer Neg Hx    Colon polyps Neg Hx    Rectal cancer Neg Hx    Social History   Tobacco Use   Smoking status: Never   Smokeless tobacco: Former    Types: Chew    Quit date: 04/28/2019  Vaping Use   Vaping Use: Never used  Substance Use Topics   Alcohol use: Yes    Comment: 07/27/2019: occassionally    Drug use: Never   Current Outpatient Medications  Medication Sig Dispense Refill   azathioprine (IMURAN) 100 MG tablet Take 2.5 tablets (250 mg total) by mouth daily. 75 tablet 0   ciprofloxacin (CIPRO) 500 MG tablet Take 1 tablet (500 mg total) by mouth 2 (two) times daily. For 10 days 20 tablet 0   diltiazem 2 % GEL Apply a pea-sized amount to the rectum twice daily 30 g 0   ferrous sulfate 325 (65 FE) MG EC tablet Take 1 tablet  (325 mg total) by mouth daily with breakfast. (Patient not taking: Reported on 09/28/2021) 30 tablet 0   Lidocaine, Anorectal, (RECTICARE) 5 % CREA Apply 1 Application topically as directed. 15 g 0   nifedipine 0.3 % ointment APPLY TO ANUS FOUR TIMES A DAY FOR ONE MONTH     Current Facility-Administered Medications  Medication Dose Route Frequency Provider Last Rate Last Admin   0.9 %  sodium chloride infusion  500 mL Intravenous Once May Ozment, Carlota Raspberry, MD       No Known Allergies   Review of Systems: All systems reviewed and negative except where noted in HPI.    No results found.  Physical Exam: There were no vitals taken for this visit. Constitutional: Pleasant,well-developed, ***male in no acute distress. HEENT: Normocephalic and atraumatic. Conjunctivae are normal. No scleral icterus. Neck supple.  Cardiovascular: Normal rate, regular rhythm.  Pulmonary/chest: Effort normal and breath sounds normal. No wheezing, rales or rhonchi. Abdominal: Soft, nondistended, nontender. Bowel sounds active throughout. There are no masses palpable. No hepatomegaly. Extremities: no edema Lymphadenopathy: No cervical adenopathy noted. Neurological: Alert and oriented to person place and time. Skin: Skin is warm and dry. No rashes noted. Psychiatric: Normal mood and affect. Behavior is normal.   ASSESSMENT: 31 y.o. male here for assessment of the following  No diagnosis found.  PLAN:   Margy Clarks, NP

## 2021-11-23 ENCOUNTER — Ambulatory Visit (INDEPENDENT_AMBULATORY_CARE_PROVIDER_SITE_OTHER): Payer: 59 | Admitting: *Deleted

## 2021-11-23 VITALS — BP 102/66 | HR 64 | Temp 98.4°F | Resp 18 | Ht 75.0 in | Wt 288.4 lb

## 2021-11-23 DIAGNOSIS — K50113 Crohn's disease of large intestine with fistula: Secondary | ICD-10-CM

## 2021-11-23 MED ORDER — ACETAMINOPHEN 325 MG PO TABS
650.0000 mg | ORAL_TABLET | Freq: Once | ORAL | Status: AC
  Administered 2021-11-23: 650 mg via ORAL
  Filled 2021-11-23: qty 2

## 2021-11-23 MED ORDER — SODIUM CHLORIDE 0.9 % IV SOLN
5.0000 mg/kg | Freq: Once | INTRAVENOUS | Status: AC
  Administered 2021-11-23: 700 mg via INTRAVENOUS
  Filled 2021-11-23: qty 70

## 2021-11-23 MED ORDER — METHYLPREDNISOLONE SODIUM SUCC 40 MG IJ SOLR
40.0000 mg | Freq: Once | INTRAMUSCULAR | Status: AC
  Administered 2021-11-23: 40 mg via INTRAVENOUS
  Filled 2021-11-23: qty 1

## 2021-11-23 MED ORDER — DIPHENHYDRAMINE HCL 25 MG PO CAPS
25.0000 mg | ORAL_CAPSULE | Freq: Once | ORAL | Status: AC
  Administered 2021-11-23: 25 mg via ORAL
  Filled 2021-11-23: qty 1

## 2021-11-23 NOTE — Progress Notes (Signed)
Diagnosis: Crohn's Disease  Provider:  Marshell Garfinkel MD  Procedure: Infusion  IV Type: Peripheral, IV Location: L Antecubital  Avsola (Infliximab-axxq), Dose: 700 mg  Infusion Start Time: 0951 am Pharmacy [facility] changed rate to rapid infusion per patient request,  Rate increased to 123m/hr  at 0957 am  Infusion Stop Time: 110 am  Post Infusion IV Care: Observation period completed and Peripheral IV Discontinued  Discharge: Condition: Good, Destination: Home . AVS provided to patient.   Performed by:  WOren Beckmann RN

## 2022-01-18 ENCOUNTER — Ambulatory Visit: Payer: 59

## 2022-01-22 ENCOUNTER — Ambulatory Visit (INDEPENDENT_AMBULATORY_CARE_PROVIDER_SITE_OTHER): Payer: 59

## 2022-01-22 VITALS — BP 114/70 | HR 52 | Temp 97.6°F | Resp 16 | Ht 75.0 in | Wt 289.0 lb

## 2022-01-22 DIAGNOSIS — K50113 Crohn's disease of large intestine with fistula: Secondary | ICD-10-CM | POA: Diagnosis not present

## 2022-01-22 MED ORDER — METHYLPREDNISOLONE SODIUM SUCC 40 MG IJ SOLR: 40.0000 mg | Freq: Once | INTRAMUSCULAR | Status: DC

## 2022-01-22 MED ORDER — ACETAMINOPHEN 325 MG PO TABS: 650.0000 mg | ORAL_TABLET | Freq: Once | ORAL | Status: DC

## 2022-01-22 MED ORDER — DIPHENHYDRAMINE HCL 25 MG PO CAPS: 25.0000 mg | ORAL_CAPSULE | Freq: Once | ORAL | Status: DC

## 2022-01-22 MED ORDER — SODIUM CHLORIDE 0.9 % IV SOLN
5.0000 mg/kg | Freq: Once | INTRAVENOUS | Status: AC
  Administered 2022-01-22: 700 mg via INTRAVENOUS
  Filled 2022-01-22: qty 70

## 2022-01-22 NOTE — Progress Notes (Signed)
Diagnosis: Crohn's Disease  Provider:  Marshell Garfinkel MD  Procedure: Infusion  IV Type: Peripheral, IV Location: R Forearm  Avsola (infliximab-axxq), Dose: '700mg'$   Infusion Start Time: 2583  Infusion Stop Time: 1050  Post Infusion IV Care: Peripheral IV Discontinued  Discharge: Condition: Good, Destination: Home . AVS provided to patient.   Performed by:  Koren Shiver, RN

## 2022-03-19 ENCOUNTER — Ambulatory Visit (INDEPENDENT_AMBULATORY_CARE_PROVIDER_SITE_OTHER): Payer: 59 | Admitting: *Deleted

## 2022-03-19 VITALS — BP 109/72 | HR 63 | Temp 97.6°F | Resp 18 | Ht 75.0 in | Wt 293.0 lb

## 2022-03-19 DIAGNOSIS — K50113 Crohn's disease of large intestine with fistula: Secondary | ICD-10-CM

## 2022-03-19 MED ORDER — METHYLPREDNISOLONE SODIUM SUCC 40 MG IJ SOLR
40.0000 mg | Freq: Once | INTRAMUSCULAR | Status: AC
  Administered 2022-03-19: 40 mg via INTRAVENOUS
  Filled 2022-03-19: qty 1

## 2022-03-19 MED ORDER — DIPHENHYDRAMINE HCL 25 MG PO CAPS
25.0000 mg | ORAL_CAPSULE | Freq: Once | ORAL | Status: AC
  Administered 2022-03-19: 25 mg via ORAL
  Filled 2022-03-19: qty 1

## 2022-03-19 MED ORDER — SODIUM CHLORIDE 0.9 % IV SOLN
700.0000 mg | Freq: Once | INTRAVENOUS | Status: AC
  Administered 2022-03-19: 700 mg via INTRAVENOUS
  Filled 2022-03-19: qty 70

## 2022-03-19 MED ORDER — ACETAMINOPHEN 325 MG PO TABS
650.0000 mg | ORAL_TABLET | Freq: Once | ORAL | Status: AC
  Administered 2022-03-19: 650 mg via ORAL
  Filled 2022-03-19: qty 2

## 2022-03-19 NOTE — Progress Notes (Signed)
Diagnosis: Crohn's Disease  Provider:  Marshell Garfinkel MD  Procedure: Infusion  IV Type: Peripheral, IV Location: L Antecubital  Avsola (infliximab-axxq), Dose: 700 mg  Infusion Start Time: 6244 am   Rapid Infusion  Infusion Stop Time: 1045 am  Post Infusion IV Care: Observation period completed and Peripheral IV Discontinued  Discharge: Condition: Good, Destination: Home . AVS provided to patient.   Performed by:  Oren Beckmann, RN

## 2022-04-11 ENCOUNTER — Telehealth: Payer: Self-pay | Admitting: Pharmacy Technician

## 2022-04-11 NOTE — Telephone Encounter (Signed)
Avsola renewal.  Auth Submission: APPROVED Payer: [employer] Medication & CPT/J Code(s) submitted: Avsola (infliximab-axxq) [id number] Route of submission (phone, fax, portal): PORTAL Phone # Fax # Auth type: Buy/Bill Units/visits requested: Q8WKS - 6 DOSES Reference number: [id number] Approval from: 04/11/22 to 04/12/23

## 2022-04-12 ENCOUNTER — Other Ambulatory Visit: Payer: Self-pay | Admitting: Pharmacy Technician

## 2022-04-27 ENCOUNTER — Other Ambulatory Visit: Payer: Self-pay

## 2022-05-14 ENCOUNTER — Ambulatory Visit: Payer: 59

## 2022-05-17 ENCOUNTER — Ambulatory Visit: Payer: 59

## 2022-05-24 ENCOUNTER — Ambulatory Visit (INDEPENDENT_AMBULATORY_CARE_PROVIDER_SITE_OTHER): Payer: 59

## 2022-05-24 VITALS — BP 113/72 | HR 59 | Temp 97.8°F | Resp 18 | Ht 75.0 in | Wt 293.8 lb

## 2022-05-24 DIAGNOSIS — K50113 Crohn's disease of large intestine with fistula: Secondary | ICD-10-CM

## 2022-05-24 MED ORDER — DIPHENHYDRAMINE HCL 25 MG PO CAPS
25.0000 mg | ORAL_CAPSULE | Freq: Once | ORAL | Status: AC
  Administered 2022-05-24: 25 mg via ORAL
  Filled 2022-05-24: qty 1

## 2022-05-24 MED ORDER — ACETAMINOPHEN 325 MG PO TABS
650.0000 mg | ORAL_TABLET | Freq: Once | ORAL | Status: AC
  Administered 2022-05-24: 650 mg via ORAL
  Filled 2022-05-24: qty 2

## 2022-05-24 MED ORDER — METHYLPREDNISOLONE SODIUM SUCC 40 MG IJ SOLR
40.0000 mg | Freq: Once | INTRAMUSCULAR | Status: AC
  Administered 2022-05-24: 40 mg via INTRAVENOUS
  Filled 2022-05-24: qty 1

## 2022-05-24 MED ORDER — SODIUM CHLORIDE 0.9 % IV SOLN
5.0000 mg/kg | Freq: Once | INTRAVENOUS | Status: AC
  Administered 2022-05-24: 700 mg via INTRAVENOUS
  Filled 2022-05-24: qty 70

## 2022-05-24 NOTE — Progress Notes (Signed)
Diagnosis: Crohn's Disease  Provider:  Marshell Garfinkel MD  Procedure: Infusion  IV Type: Peripheral, IV Location: L Antecubital  Avsola (infliximab-axxq), Dose: 759m  Infusion Start Time: 1110  Infusion Stop Time: 1218  Post Infusion IV Care: Peripheral IV Discontinued  Discharge: Condition: Good, Destination: Home . AVS Declined  Performed by:  LAdelina Mings LPN

## 2022-07-19 ENCOUNTER — Ambulatory Visit (INDEPENDENT_AMBULATORY_CARE_PROVIDER_SITE_OTHER): Payer: 59

## 2022-07-19 VITALS — BP 99/65 | HR 61 | Temp 97.7°F | Resp 18 | Ht 75.0 in | Wt 289.6 lb

## 2022-07-19 DIAGNOSIS — K50113 Crohn's disease of large intestine with fistula: Secondary | ICD-10-CM

## 2022-07-19 MED ORDER — ACETAMINOPHEN 325 MG PO TABS
650.0000 mg | ORAL_TABLET | Freq: Once | ORAL | Status: AC
  Administered 2022-07-19: 650 mg via ORAL
  Filled 2022-07-19: qty 2

## 2022-07-19 MED ORDER — SODIUM CHLORIDE 0.9 % IV SOLN
5.0000 mg/kg | Freq: Once | INTRAVENOUS | Status: AC
  Administered 2022-07-19: 700 mg via INTRAVENOUS
  Filled 2022-07-19: qty 70

## 2022-07-19 MED ORDER — METHYLPREDNISOLONE SODIUM SUCC 40 MG IJ SOLR
40.0000 mg | Freq: Once | INTRAMUSCULAR | Status: AC
  Administered 2022-07-19: 40 mg via INTRAVENOUS
  Filled 2022-07-19: qty 1

## 2022-07-19 MED ORDER — DIPHENHYDRAMINE HCL 25 MG PO CAPS
25.0000 mg | ORAL_CAPSULE | Freq: Once | ORAL | Status: AC
  Administered 2022-07-19: 25 mg via ORAL
  Filled 2022-07-19: qty 1

## 2022-07-19 NOTE — Progress Notes (Signed)
Diagnosis: Crohn's Disease  Provider:  Chilton Greathouse MD  Procedure: Infusion  IV Type: Peripheral, IV Location: R Antecubital  Avsola (infliximab-axxq), Dose: 700  Infusion Start Time: 1105  Infusion Stop Time: 1212  Post Infusion IV Care: Peripheral IV Discontinued  Discharge: Condition: Good, Destination: Home . AVS Declined  Performed by:  Adriana Mccallum, RN

## 2022-08-10 ENCOUNTER — Other Ambulatory Visit (INDEPENDENT_AMBULATORY_CARE_PROVIDER_SITE_OTHER): Payer: 59

## 2022-08-10 ENCOUNTER — Encounter: Payer: Self-pay | Admitting: Gastroenterology

## 2022-08-10 ENCOUNTER — Ambulatory Visit: Payer: 59 | Admitting: Gastroenterology

## 2022-08-10 VITALS — BP 120/68 | HR 69 | Ht 74.0 in | Wt 291.0 lb

## 2022-08-10 DIAGNOSIS — K603 Anal fistula: Secondary | ICD-10-CM

## 2022-08-10 DIAGNOSIS — K50113 Crohn's disease of large intestine with fistula: Secondary | ICD-10-CM

## 2022-08-10 DIAGNOSIS — Z79899 Other long term (current) drug therapy: Secondary | ICD-10-CM | POA: Diagnosis not present

## 2022-08-10 LAB — CBC WITH DIFFERENTIAL/PLATELET
Basophils Absolute: 0.1 10*3/uL (ref 0.0–0.1)
Basophils Relative: 0.8 % (ref 0.0–3.0)
Eosinophils Absolute: 0.1 10*3/uL (ref 0.0–0.7)
Eosinophils Relative: 2.2 % (ref 0.0–5.0)
HCT: 42.3 % (ref 39.0–52.0)
Hemoglobin: 14.3 g/dL (ref 13.0–17.0)
Lymphocytes Relative: 17.2 % (ref 12.0–46.0)
Lymphs Abs: 1.1 10*3/uL (ref 0.7–4.0)
MCHC: 33.8 g/dL (ref 30.0–36.0)
MCV: 76.2 fl — ABNORMAL LOW (ref 78.0–100.0)
Monocytes Absolute: 1 10*3/uL (ref 0.1–1.0)
Monocytes Relative: 16 % — ABNORMAL HIGH (ref 3.0–12.0)
Neutro Abs: 4.1 10*3/uL (ref 1.4–7.7)
Neutrophils Relative %: 63.8 % (ref 43.0–77.0)
Platelets: 208 10*3/uL (ref 150.0–400.0)
RBC: 5.56 Mil/uL (ref 4.22–5.81)
RDW: 14.5 % (ref 11.5–15.5)
WBC: 6.5 10*3/uL (ref 4.0–10.5)

## 2022-08-10 LAB — COMPREHENSIVE METABOLIC PANEL
ALT: 42 U/L (ref 0–53)
AST: 26 U/L (ref 0–37)
Albumin: 4.3 g/dL (ref 3.5–5.2)
Alkaline Phosphatase: 89 U/L (ref 39–117)
BUN: 21 mg/dL (ref 6–23)
CO2: 26 mEq/L (ref 19–32)
Calcium: 9.4 mg/dL (ref 8.4–10.5)
Chloride: 102 mEq/L (ref 96–112)
Creatinine, Ser: 1.06 mg/dL (ref 0.40–1.50)
GFR: 94.16 mL/min (ref >60.00)
Glucose, Bld: 96 mg/dL (ref 70–99)
Potassium: 3.7 mEq/L (ref 3.5–5.1)
Sodium: 137 mEq/L (ref 135–145)
Total Bilirubin: 1.1 mg/dL (ref 0.2–1.2)
Total Protein: 7.9 g/dL (ref 6.0–8.3)

## 2022-08-10 LAB — VITAMIN D 25 HYDROXY (VIT D DEFICIENCY, FRACTURES): VITD: 13.87 ng/mL — ABNORMAL LOW (ref 30.00–100.00)

## 2022-08-10 NOTE — Progress Notes (Signed)
HPI :  32 y/o male here for a follow up visit for Crohn's disease.   I have not seen him in almost a year.  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  Colonoscopy confirmed colonic inflammation in the distal left colon, associated with perianal fistulas on MRI of the pelvis.  We had discussed options, I placed him on Avsola and Imuran following initial workup.  We started this in late June/July.  I had scheduled him a follow-up visit to see [place] in August, he states he unfortunately had a canceled that appointment for some reason and was not able to follow-up at that time and has not followed up since then.  He saw Dr. Angelena Form in the colorectal surgery clinic and was scheduled for an EUA for his perianal fistula, looks like that never happened either.  He reports in general the Avsola injections have gone quite well, on 5 mg/kg every 8 weeks.  He was also on Imuran 250 mg daily at onset, sounds like he took this for perhaps 1 month and then ran out and stopped, did not call for refill.  He tolerated that okay.  He reports his symptoms have definitely improved with the infusions.  Stool frequency, abdominal pains, perianal symptoms all improved on the regimen.  He states now on maintenance dosing every 8 weeks.  Within 1 to 2 weeks before his next infusion he will have some recurrent rectal bleeding with some increased stool frequency and abdominal cramping, associated with some occasional perianal pains.  He has had no recurrence of perianal/rectal abscess.  Does not have any routine drainage from the fistula.  He does not smoke tobacco.  No joint pains, no rashes.  He states his weight has been relatively stable over the past year.    He was treated with Cipro and Flagyl initially when he was diagnosed with fistulas and suspicion for abscess.  He completed that regimen and has not needed antibiotics since then from what he can recall.  He  unfortunately has not had any labs since our initial workup last year.  He has not had pneumonia vaccine, Shingrix etc.  Is in need for follow-up labs   MRI pelvis 09/12/21: IMPRESSION: 1. 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 One of these appears to be blind-ending in the other to extend towards and possibly reach the gluteal cleft. 2. Enlarged perirectal lymph nodes. Mildly enlarged left external iliac node. These may be reactive. 3. Mild presacral edema. 4. No drainable abscess observed.    Colonoscopy 09/28/21: - The perianal and digital rectal examinations were normal. - The terminal ileum appeared normal. - A diminutive polyp was found in the rectum. The polyp was sessile. The polyp was removed with a cold biopsy forceps. Resection and retrieval were complete. - Patchy mild inflammation characterized by erosions, erythema, granularity and loss of vascularity was found in the distal rectum, in the sigmoid colon, in the descending colon, at the splenic flexure, in the distal transverse colon and in the cecum. Most affected area was mid sigmoid colon. Biopsies were taken with a cold forceps for histology from the right, transverse, and left colon. - Internal hemorrhoids were found during retroflexion. The hemorrhoids were small. - The exam was otherwise without abnormality   1. Surgical [P], right colon - UNREMARKABLE COLONIC MUCOSA. - NO ACTIVE INFLAMMATION OR CHRONIC CHANGES. - NEGATIVE FOR DYSPLASIA. 2. Surgical [P], colon, transverse - MILDLY ACTIVE CHRONIC COLITIS  WITH GRANULOMAS CONSISTENT WITH CROHN'S. - NEGATIVE FOR DYSPLASIA. 3. Surgical [P], left colon biopsy - FOCAL ACTIVE COLITIS WITH FOCAL EROSION. - NEGATIVE FOR DYSPLASIA. 4. Surgical [P], colon, rectum, polyp (1) - INFLAMMATORY POLYP. - NEGATIVE FOR DYSPLASIA.   Past Medical History:  Diagnosis Date   Asthma    07/27/2019: per patient "as a kid"   Crohn's disease (HCC)    colonic Crohn's with  perianal fistula     Past Surgical History:  Procedure Laterality Date   LUMBAR LAMINECTOMY/DECOMPRESSION MICRODISCECTOMY Right 07/30/2019   Procedure: Right L5-S1disectomy;  Surgeon: Venita Lick, MD;  Location: [facility] OR;  Service: Orthopedics;  Laterality: Right;  2.5hrs   TONSILLECTOMY     Family History  Problem Relation Age of Onset   Colon cancer Neg Hx    Stomach cancer Neg Hx    Esophageal cancer Neg Hx    Colon polyps Neg Hx    Rectal cancer Neg Hx    Social History   Tobacco Use   Smoking status: Never   Smokeless tobacco: Former    Types: Chew    Quit date: 04/28/2019  Vaping Use   Vaping Use: Never used  Substance Use Topics   Alcohol use: Yes    Comment: 07/27/2019: occassionally    Drug use: Never   Current Outpatient Medications  Medication Sig Dispense Refill   azathioprine (IMURAN) 100 MG tablet Take 2.5 tablets (250 mg total) by mouth daily. 75 tablet 0   ciprofloxacin (CIPRO) 500 MG tablet Take 1 tablet (500 mg total) by mouth 2 (two) times daily. For 10 days 20 tablet 0   diltiazem 2 % GEL Apply a pea-sized amount to the rectum twice daily 30 g 0   ferrous sulfate 325 (65 FE) MG EC tablet Take 1 tablet (325 mg total) by mouth daily with breakfast. 30 tablet 0   Lidocaine, Anorectal, (RECTICARE) 5 % CREA Apply 1 Application topically as directed. 15 g 0   nifedipine 0.3 % ointment APPLY TO ANUS FOUR TIMES A DAY FOR ONE MONTH     Current Facility-Administered Medications  Medication Dose Route Frequency Provider Last Rate Last Admin   0.9 %  sodium chloride infusion  500 mL Intravenous Once Shaheen Star, Willaim Rayas, MD       No Known Allergies   Review of Systems: All systems reviewed and negative except where noted in HPI.   Lab Results  Component Value Date   WBC 10.4 09/11/2021   HGB 12.9 (L) 09/11/2021   HCT 39.5 09/11/2021   MCV 72.6 (L) 09/11/2021   PLT 299.0 09/11/2021    Lab Results  Component Value Date   CREATININE 1.01 09/11/2021    BUN 15 09/11/2021   NA 138 09/11/2021   K 4.2 09/11/2021   CL 103 09/11/2021   CO2 27 09/11/2021    Lab Results  Component Value Date   ALT 32 09/11/2021   AST 23 09/11/2021   ALKPHOS 96 09/11/2021   BILITOT 0.6 09/11/2021     Physical Exam: BP 120/68   Pulse 69   Ht 6\' 2"  (1.88 m)   Wt 291 lb (132 kg)   BMI 37.36 kg/m  Constitutional: Pleasant,well-developed, male in no acute distress. Abdominal: Soft, nondistended, nontender.  There are no masses palpable. No hepatomegaly. Perianal exam - anterior fistulous tract but no drainage / purulence noted, otherwise okay, scarring noted Extremities: no edema Neurological: Alert and oriented to person place and time. Skin: Skin is warm and dry. No  rashes noted. Psychiatric: Normal mood and affect. Behavior is normal.   ASSESSMENT: 32 y.o. male here for assessment of the following  1. Crohn's disease of large intestine with fistula (HCC)   2. Perianal fistula   3. High risk medication use    Colonic Crohn's complicated by perianal fistula.  Diagnosed last year, placed on Avsola and Imuran initially, plans were to close follow-up in the clinic which was scheduled but he canceled the appointment and has not showed up for the past year.  He has not followed up for labs.  Initially started Imuran for what sounds like a month and then stopped it.  Clinically significantly improved with Avsola monotherapy, although having some breakthrough symptoms just prior to his next infusion in recent months.  Quite possible his trough level is low or he is building up some immunogenicity to the Avsola.  We discussed options.  First of all, very important that he see [place] in the clinic at least every 6 months, he needs to be monitored with labs and evaluation while on this medication regimen.  I counseled him on this and he understands.  I do think he would likely benefit from resuming Imuran for at least 1 year to help reduce immunogenicity of Avsola.   We will get baseline labs done today to make sure stable and if so can resume the Imuran.  We will also plan on getting Avsola levels and check for immunogenicity.  If his levels are low/subtherapeutic and no antibody formation, we will likely increase his dosing frequency.  Adding Imuran will also help with this.  We discussed the risks of the regimen in general to include immunosuppression, risk for lymphoma, and other more rare risks.  He understands this and wishes to proceed/continue.  I will have him go to the lab for TB testing, as well as check vitamin D, baseline labs, and we will also check a fecal calprotectin to assess more objectively for active inflammation.  I will relay results to him once we have his labs back, with further recommendations.  He must have the Avsola level checked as a trough level, can come to our office the morning of his next infusion in June, he understands.  We also discussed importance of vaccination to being on this regimen.  He should go to his primary care for pneumonia vaccine, discuss Shingrix as well.  PLAN: - lab today - CBC, CMET, vitamin D, quantiferon gold - lab for fecal calprotectin - continue Avsola for now as currently dosed - check Infliximab trough level and AB level the AM prior to his next infusion - may resume Imuran if labs okay in the interim - may need to increase Avsola dosing or switch regimen based on findings and his course - get pneumonia vaccine with PCP, also recommend Shingrix - f/u 6 months or sooner with any issues  Harlin Rain, MD [facility]

## 2022-08-10 NOTE — Patient Instructions (Signed)
Your provider has requested that you go to the basement level for lab work before leaving today. Press "B" on the elevator. The lab is located at the first door on the left as you exit the elevator.  On 09/13/22 (the day of your Avsola infusion), please come by our lab BEFORE infusion for labwork.  Please get a pneumonia injection from your primary care physician.  _______________________________________________________  If your blood pressure at your visit was 140/90 or greater, please contact your primary care physician to follow up on this.  _______________________________________________________  If you are age 57 or older, your body mass index should be between 23-30. Your Body mass index is 37.36 kg/m. If this is out of the aforementioned range listed, please consider follow up with your Primary Care Provider.  If you are age 95 or younger, your body mass index should be between 19-25. Your Body mass index is 37.36 kg/m. If this is out of the aformentioned range listed, please consider follow up with your Primary Care Provider.   ________________________________________________________  The [facility] providers would like to encourage you to use [facility] to communicate with providers for non-urgent requests or questions.  Due to long hold times on the telephone, sending your provider a message by [facility] may be a faster and more efficient way to get a response.  Please allow 48 business hours for a response.  Please remember that this is for non-urgent requests.  _______________________________________________________  Due to recent changes in healthcare laws, you may see the results of your imaging and laboratory studies on MyChart before your provider has had a chance to review them.  We understand that in some cases there may be results that are confusing or concerning to you. Not all laboratory results come back in the same time frame and the provider may be waiting for multiple results  in order to interpret others.  Please give [place] 48 hours in order for your provider to thoroughly review all the results before contacting the office for clarification of your results.

## 2022-08-13 ENCOUNTER — Other Ambulatory Visit: Payer: Self-pay

## 2022-08-13 DIAGNOSIS — E559 Vitamin D deficiency, unspecified: Secondary | ICD-10-CM

## 2022-08-13 DIAGNOSIS — Z79899 Other long term (current) drug therapy: Secondary | ICD-10-CM

## 2022-08-13 DIAGNOSIS — K50113 Crohn's disease of large intestine with fistula: Secondary | ICD-10-CM

## 2022-08-13 MED ORDER — AZATHIOPRINE 100 MG PO TABS: 250.0000 mg | ORAL_TABLET | Freq: Every day | ORAL | 1 refills | Status: AC

## 2022-08-13 MED ORDER — VITAMIN D 125 MCG (5000 UT) PO CAPS: ORAL_CAPSULE | ORAL | 0 refills | Status: AC

## 2022-08-14 ENCOUNTER — Other Ambulatory Visit: Payer: Self-pay

## 2022-08-14 DIAGNOSIS — K50113 Crohn's disease of large intestine with fistula: Secondary | ICD-10-CM

## 2022-08-14 LAB — SPECIMEN STATUS REPORT

## 2022-08-14 LAB — QUANTIFERON-TB GOLD PLUS

## 2022-08-15 ENCOUNTER — Other Ambulatory Visit: Payer: 59

## 2022-08-15 DIAGNOSIS — Z79899 Other long term (current) drug therapy: Secondary | ICD-10-CM

## 2022-08-15 DIAGNOSIS — K603 Anal fistula: Secondary | ICD-10-CM

## 2022-08-15 DIAGNOSIS — K50113 Crohn's disease of large intestine with fistula: Secondary | ICD-10-CM

## 2022-08-15 LAB — QUANTIFERON-TB GOLD PLUS

## 2022-08-16 LAB — QUANTIFERON-TB GOLD PLUS: QuantiFERON TB1 Ag Value: 0.06 IU/mL

## 2022-08-20 ENCOUNTER — Encounter: Payer: Self-pay | Admitting: Gastroenterology

## 2022-08-20 ENCOUNTER — Telehealth: Payer: Self-pay

## 2022-08-20 NOTE — Telephone Encounter (Signed)
MyChart message rec'd from patient and forwarded to Dr. Adela Lank. FMLA forms have been placed on provider's desk for review and completion

## 2022-08-20 NOTE — Telephone Encounter (Signed)
Called to ask patient for clarity regarding what he is requesting of Dr. Adela Lank in completing the FMLA forms that were drops off.  Specific dates? Frequency and duration of flares?... spoke to wife.  She indicated that she will relay the message to [place] and he will send Dr. Adela Lank a MyChart message detailing what he is requesting.

## 2022-08-21 DIAGNOSIS — Z0279 Encounter for issue of other medical certificate: Secondary | ICD-10-CM

## 2022-08-23 LAB — CALPROTECTIN, FECAL: Calprotectin, Fecal: 173 ug/g — ABNORMAL HIGH (ref 0–120)

## 2022-08-24 ENCOUNTER — Telehealth: Payer: Self-pay | Admitting: Gastroenterology

## 2022-08-24 NOTE — Telephone Encounter (Signed)
This has been addressed. See telephone encounter.

## 2022-08-24 NOTE — Telephone Encounter (Signed)
Pts wife states that pt was walking into work last night and felt dizzy and a little SOB. He had some blood in his stool yesterday and today. Asked if he was drinking plenty of fluids as it has been hot and he could be dehydrated. Reports he has been very thirsty. Blood count was good at last check on 08/10/22. Suggested they contact his PCP to see if they could see him or check labs. He has also been taking his iron pills. She verbalized understanding and copy of labs faxed over to PCP office.

## 2022-08-24 NOTE — Telephone Encounter (Signed)
Inbound cal from patient spouse stated that she has health questions regarding her husband would like to discuss with a nurse.Roger KitchenPlease advise

## 2022-08-26 ENCOUNTER — Encounter: Payer: Self-pay | Admitting: Gastroenterology

## 2022-08-26 DIAGNOSIS — K50113 Crohn's disease of large intestine with fistula: Secondary | ICD-10-CM

## 2022-08-26 DIAGNOSIS — Z79899 Other long term (current) drug therapy: Secondary | ICD-10-CM

## 2022-08-26 DIAGNOSIS — K529 Noninfective gastroenteritis and colitis, unspecified: Secondary | ICD-10-CM

## 2022-08-28 MED ORDER — PREDNISONE 10 MG PO TABS: ORAL_TABLET | ORAL | 0 refills | Status: AC

## 2022-08-30 ENCOUNTER — Other Ambulatory Visit: Payer: 59

## 2022-08-31 ENCOUNTER — Ambulatory Visit: Payer: 59

## 2022-08-31 DIAGNOSIS — Z79899 Other long term (current) drug therapy: Secondary | ICD-10-CM

## 2022-08-31 DIAGNOSIS — K529 Noninfective gastroenteritis and colitis, unspecified: Secondary | ICD-10-CM

## 2022-08-31 DIAGNOSIS — K50113 Crohn's disease of large intestine with fistula: Secondary | ICD-10-CM

## 2022-09-01 LAB — CLOSTRIDIUM DIFFICILE BY PCR: Toxigenic C. Difficile by PCR: NEGATIVE

## 2022-09-04 ENCOUNTER — Encounter: Payer: Self-pay | Admitting: Gastroenterology

## 2022-09-04 NOTE — Addendum Note (Signed)
Addended by: Missy Sabins on: 09/04/2022 02:12 PM   Modules accepted: Orders

## 2022-09-04 NOTE — Addendum Note (Signed)
Addended by: Missy Sabins on: 09/04/2022 02:07 PM   Modules accepted: Orders

## 2022-09-05 ENCOUNTER — Telehealth: Payer: Self-pay

## 2022-09-05 NOTE — Telephone Encounter (Signed)
MyChart message sent to patient to go to lab for Dr. Adela Lank

## 2022-09-05 NOTE — Telephone Encounter (Signed)
Avsola infusion is tomorrow at 1:15 pm.  New orders for Avsola 5 mg/kg every 4 weeks were entered yesterday.

## 2022-09-05 NOTE — Telephone Encounter (Signed)
-----   Message from Cooper Render, CMA sent at 08/13/2022 11:20 AM EDT ----- Regarding: due for labs Due for LFTs and CBC in early June. Orders are in

## 2022-09-06 ENCOUNTER — Ambulatory Visit (INDEPENDENT_AMBULATORY_CARE_PROVIDER_SITE_OTHER): Payer: 59

## 2022-09-06 ENCOUNTER — Telehealth: Payer: Self-pay | Admitting: Pharmacy Technician

## 2022-09-06 ENCOUNTER — Other Ambulatory Visit (INDEPENDENT_AMBULATORY_CARE_PROVIDER_SITE_OTHER): Payer: 59

## 2022-09-06 ENCOUNTER — Other Ambulatory Visit: Payer: Self-pay

## 2022-09-06 VITALS — BP 101/66 | HR 65 | Temp 98.4°F | Resp 12 | Ht 75.0 in | Wt 286.6 lb

## 2022-09-06 DIAGNOSIS — Z79899 Other long term (current) drug therapy: Secondary | ICD-10-CM | POA: Diagnosis not present

## 2022-09-06 DIAGNOSIS — K50113 Crohn's disease of large intestine with fistula: Secondary | ICD-10-CM

## 2022-09-06 DIAGNOSIS — E559 Vitamin D deficiency, unspecified: Secondary | ICD-10-CM

## 2022-09-06 LAB — CBC WITH DIFFERENTIAL/PLATELET
Basophils Absolute: 0.1 10*3/uL (ref 0.0–0.1)
Basophils Relative: 0.7 % (ref 0.0–3.0)
Eosinophils Absolute: 0.1 10*3/uL (ref 0.0–0.7)
Eosinophils Relative: 1 % (ref 0.0–5.0)
HCT: 42.6 % (ref 39.0–52.0)
Hemoglobin: 14.2 g/dL (ref 13.0–17.0)
Lymphocytes Relative: 18 % (ref 12.0–46.0)
Lymphs Abs: 1.6 10*3/uL (ref 0.7–4.0)
MCHC: 33.3 g/dL (ref 30.0–36.0)
MCV: 76.6 fl — ABNORMAL LOW (ref 78.0–100.0)
Monocytes Absolute: 0.7 10*3/uL (ref 0.1–1.0)
Monocytes Relative: 7.8 % (ref 3.0–12.0)
Neutro Abs: 6.6 10*3/uL (ref 1.4–7.7)
Neutrophils Relative %: 72.5 % (ref 43.0–77.0)
Platelets: 270 10*3/uL (ref 150.0–400.0)
RBC: 5.56 Mil/uL (ref 4.22–5.81)
RDW: 14.2 % (ref 11.5–15.5)
WBC: 9.1 10*3/uL (ref 4.0–10.5)

## 2022-09-06 LAB — HEPATIC FUNCTION PANEL
ALT: 35 U/L (ref 0–53)
AST: 19 U/L (ref 0–37)
Albumin: 4.4 g/dL (ref 3.5–5.2)
Alkaline Phosphatase: 65 U/L (ref 39–117)
Bilirubin, Direct: 0.2 mg/dL (ref 0.0–0.3)
Total Bilirubin: 0.8 mg/dL (ref 0.2–1.2)
Total Protein: 7.9 g/dL (ref 6.0–8.3)

## 2022-09-06 LAB — VITAMIN D 25 HYDROXY (VIT D DEFICIENCY, FRACTURES): VITD: 24.88 ng/mL — ABNORMAL LOW (ref 30.00–100.00)

## 2022-09-06 LAB — IBC + FERRITIN
Ferritin: 54.1 ng/mL (ref 22.0–322.0)
Iron: 49 ug/dL (ref 42–165)
Saturation Ratios: 17.1 % — ABNORMAL LOW (ref 20.0–50.0)
TIBC: 287 ug/dL (ref 250.0–450.0)
Transferrin: 205 mg/dL — ABNORMAL LOW (ref 212.0–360.0)

## 2022-09-06 MED ORDER — ACETAMINOPHEN 325 MG PO TABS
650.0000 mg | ORAL_TABLET | Freq: Once | ORAL | Status: AC
  Administered 2022-09-06: 650 mg via ORAL
  Filled 2022-09-06: qty 2

## 2022-09-06 MED ORDER — DIPHENHYDRAMINE HCL 25 MG PO CAPS
25.0000 mg | ORAL_CAPSULE | Freq: Once | ORAL | Status: AC
  Administered 2022-09-06: 25 mg via ORAL
  Filled 2022-09-06: qty 1

## 2022-09-06 MED ORDER — METHYLPREDNISOLONE SODIUM SUCC 40 MG IJ SOLR
40.0000 mg | Freq: Once | INTRAMUSCULAR | Status: AC
  Administered 2022-09-06: 40 mg via INTRAVENOUS
  Filled 2022-09-06: qty 1

## 2022-09-06 MED ORDER — SODIUM CHLORIDE 0.9 % IV SOLN
5.0000 mg/kg | INTRAVENOUS | Status: DC
  Administered 2022-09-06: 700 mg via INTRAVENOUS
  Filled 2022-09-06: qty 70

## 2022-09-06 NOTE — Telephone Encounter (Signed)
Great, thanks

## 2022-09-06 NOTE — Progress Notes (Signed)
Called and spoke to the lab.  They had not released the IBC/Ferritin order. They will release the order and process it

## 2022-09-06 NOTE — Progress Notes (Signed)
Diagnosis: Crohn's Disease  Provider:  Chilton Greathouse MD  Procedure: IV Infusion  IV Type: Peripheral, IV Location: R Forearm  Avsola (infliximab-axxq), Dose: 700mg   Infusion Start Time: 1352  Infusion Stop Time: 1511  Post Infusion IV Care: Peripheral IV Discontinued  Discharge: Condition: Good, Destination: Home . AVS Declined  Performed by:  Garnette Czech, RN

## 2022-09-06 NOTE — Telephone Encounter (Signed)
F/U:  Avsola Q4WK dosing has been approved. Patient has been scheduled.  Auth Submission: APPROVED Site of care: Site of care: [facility] Payer: [employer] Medication & CPT/J Code(s) submitted: Avsola (infliximab-axxq) [phone] Route of submission (phone, fax, portal): PORTAL Phone # Fax # Auth type: Buy/Bill Units/visits requested: 5mg /kg - q4wks  Reference number: [id number] Approval from: 09/04/22 to 09/03/23

## 2022-09-10 ENCOUNTER — Encounter: Payer: Self-pay | Admitting: Gastroenterology

## 2022-09-13 ENCOUNTER — Ambulatory Visit: Payer: 59

## 2022-09-13 LAB — SERIAL MONITORING

## 2022-09-14 ENCOUNTER — Telehealth: Payer: Self-pay

## 2022-09-14 LAB — INFLIXIMAB+AB (SERIAL MONITOR)
Anti-Infliximab Antibody: 58 ng/mL
Infliximab Drug Level: <0.4 ug/mL

## 2022-09-14 NOTE — Telephone Encounter (Signed)
-----   Message from Benancio Deeds, MD sent at 09/14/2022  7:43 AM EDT ----- Sharol Harness can you help relay this to the patient: - his levels of Avsola / remicade were undetectable and his body has started to develop an immune response to it (it is a low level antibody). This is why he has not been feeling well lately - we increased his dosing to every 4 weeks. I hope that has helped. We have also resumed Imuran which may make the antibody go away, I hope it will, time will tell - I would like to change his dosing of Avsola to 10mg /kg every 4 weeks and see how he does with this, and continue Imuran. If he feels better on this regimen of more frequent / higher dosing we will continue it. If it is NOT working for him he needs to let us know and I would have a low threshold to stop therapy and transition him to something else.  - he needs a follow up with me if you can help coordinate an office visit in the next few months.  - if any worsening please let me know. Hopefully his insurance will approve Avsola 10mg /kg every 4 weeks if you can ask the infusion center? Thank you

## 2022-09-14 NOTE — Telephone Encounter (Signed)
Roger [name], we recently increased Avsola 5 mg/kg to every 4 weeks. Dr. Adela Lank is now interested in changing order to Avsola 10 mg/kg every 4 weeks. Are you able to check and see if insurance will cover higher dose?   Do I need to go ahead and put in new therapy plan for increased dose? Thanks for your help.

## 2022-09-18 ENCOUNTER — Other Ambulatory Visit: Payer: Self-pay | Admitting: Pharmacy Technician

## 2022-09-18 NOTE — Telephone Encounter (Signed)
[place], I will submit new auth for the increased dose and f/u once I have a response from the insurance. Selena Batten

## 2022-09-19 ENCOUNTER — Telehealth: Payer: Self-pay | Admitting: Pharmacy Technician

## 2022-09-19 NOTE — Telephone Encounter (Addendum)
Auth Submission: APPROVED Site of care: Site of care: [facility] Payer: [employer] Medication & CPT/J Code(s) submitted: Avsola (infliximab-axxq) [phone] Route of submission (phone, fax, portal): PORTAL Phone # Fax # Auth type: Buy/Bill Units/visits requested: 10MG /KG Q4WKS Reference number: [id number] Approval from:  6/12 - 09/18/23.

## 2022-09-23 ENCOUNTER — Emergency Department (HOSPITAL_BASED_OUTPATIENT_CLINIC_OR_DEPARTMENT_OTHER): Payer: 59 | Admitting: Radiology

## 2022-09-23 ENCOUNTER — Other Ambulatory Visit: Payer: Self-pay

## 2022-09-23 ENCOUNTER — Emergency Department (HOSPITAL_BASED_OUTPATIENT_CLINIC_OR_DEPARTMENT_OTHER)
Admission: EM | Admit: 2022-09-23 | Discharge: 2022-09-24 | Disposition: A | Discharge status: Home / Self Care | Payer: 59 | Attending: Emergency Medicine | Admitting: Emergency Medicine

## 2022-09-23 DIAGNOSIS — Z7951 Long term (current) use of inhaled steroids: Secondary | ICD-10-CM | POA: Diagnosis not present

## 2022-09-23 DIAGNOSIS — R0789 Other chest pain: Secondary | ICD-10-CM | POA: Diagnosis not present

## 2022-09-23 DIAGNOSIS — Z87891 Personal history of nicotine dependence: Secondary | ICD-10-CM | POA: Insufficient documentation

## 2022-09-23 DIAGNOSIS — J45909 Unspecified asthma, uncomplicated: Secondary | ICD-10-CM | POA: Insufficient documentation

## 2022-09-23 DIAGNOSIS — T7840XA Allergy, unspecified, initial encounter: Secondary | ICD-10-CM | POA: Diagnosis present

## 2022-09-23 LAB — TROPONIN I (HIGH SENSITIVITY): Troponin I (High Sensitivity): 3 ng/L (ref <18)

## 2022-09-23 LAB — BASIC METABOLIC PANEL
Anion gap: 9 (ref 5–15)
BUN: 13 mg/dL (ref 6–20)
CO2: 28 mmol/L (ref 22–32)
Calcium: 9.7 mg/dL (ref 8.9–10.3)
Chloride: 103 mmol/L (ref 98–111)
Creatinine, Ser: 1.05 mg/dL (ref 0.61–1.24)
GFR, Estimated: >60 mL/min (ref >60)
Glucose, Bld: 95 mg/dL (ref 70–99)
Potassium: 3.6 mmol/L (ref 3.5–5.1)
Sodium: 140 mmol/L (ref 135–145)

## 2022-09-23 LAB — CBC
HCT: 42.7 % (ref 39.0–52.0)
Hemoglobin: 14.2 g/dL (ref 13.0–17.0)
MCH: 26 pg (ref 26.0–34.0)
MCHC: 33.3 g/dL (ref 30.0–36.0)
MCV: 78.1 fL — ABNORMAL LOW (ref 80.0–100.0)
Platelets: 256 10*3/uL (ref 150–400)
RBC: 5.47 MIL/uL (ref 4.22–5.81)
RDW: 14.6 % (ref 11.5–15.5)
WBC: 11.8 10*3/uL — ABNORMAL HIGH (ref 4.0–10.5)
nRBC: 0 % (ref 0.0–0.2)

## 2022-09-23 NOTE — ED Triage Notes (Signed)
Pt arrives with c/o chest pain that started today. Pe rpt, he had a sudden rash develop 2 days ago and it got better with benadryl, but today he started having CP and rash has come back. Pt endorse ABD pain as well. Pt believes he is having an allergic reaction. Pt denies allergies to anything. Pt has hives on his back. Pt denies difficulty swallowing or breathing.

## 2022-09-24 LAB — TROPONIN I (HIGH SENSITIVITY): Troponin I (High Sensitivity): 3 ng/L (ref <18)

## 2022-09-24 MED ORDER — DIPHENHYDRAMINE HCL 25 MG PO CAPS
50.0000 mg | ORAL_CAPSULE | Freq: Once | ORAL | Status: AC
  Administered 2022-09-24: 50 mg via ORAL
  Filled 2022-09-24: qty 2

## 2022-09-24 MED ORDER — EPINEPHRINE 0.3 MG/0.3ML IJ SOAJ: INTRAMUSCULAR | 0 refills | Status: AC

## 2022-09-24 MED ORDER — FAMOTIDINE 20 MG PO TABS
20.0000 mg | ORAL_TABLET | Freq: Once | ORAL | Status: AC
  Administered 2022-09-24: 20 mg via ORAL
  Filled 2022-09-24: qty 1

## 2022-09-24 NOTE — ED Provider Notes (Signed)
[facility] Provider Note: Lowella Dell, MD, FACEP  CSN: [id number] MRN: [id number] ARRIVAL: 09/23/22 at 2122 ROOM: DB004/DB004   CHIEF COMPLAINT  Chest Pain   HISTORY OF PRESENT ILLNESS  09/24/22 12:36 AM Roger [name] is a 32 y.o. male with a history of Crohn's, currently on prednisone.  2 days ago he had an allergic reaction that manifested itself is lip swelling, throat swelling and generalized urticarial rash.  He has been taking Benadryl 25 mg every 4 hours since then with significant improvement but not complete elimination of the hives.  He still has some hives on his back.  About 8:30 PM he was driving and developed sharp pain across his upper chest that was moderate in severity.  He does not know if it changed with breathing.  He denies associated shortness of breath, diaphoresis or nausea.  The pain lasted about 15 minutes and is now gone.    Past Medical History:  Diagnosis Date   Asthma    07/27/2019: per patient "as a kid"   Crohn's disease (HCC)    colonic Crohn's with perianal fistula    Past Surgical History:  Procedure Laterality Date   LUMBAR LAMINECTOMY/DECOMPRESSION MICRODISCECTOMY Right 07/30/2019   Procedure: Right L5-S1disectomy;  Surgeon: Venita Lick, MD;  Location: [facility] OR;  Service: Orthopedics;  Laterality: Right;  2.5hrs   TONSILLECTOMY      Family History  Problem Relation Age of Onset   Colon cancer Neg Hx    Stomach cancer Neg Hx    Esophageal cancer Neg Hx    Colon polyps Neg Hx    Rectal cancer Neg Hx     Social History   Tobacco Use   Smoking status: Never   Smokeless tobacco: Former    Types: Chew    Quit date: 04/28/2019  Vaping Use   Vaping Use: Never used  Substance Use Topics   Alcohol use: Yes    Comment: 07/27/2019: occassionally    Drug use: Never    Prior to Admission medications   Medication Sig Start Date End Date Taking? Authorizing Provider  EPINEPHrine 0.3 mg/0.3 mL IJ SOAJ injection Self inject per  package instructions as needed for severe allergic reaction and seek medical attention. 09/24/22  Yes Concettina Leth, MD  azathioprine (IMURAN) 100 MG tablet Take 2.5 tablets (250 mg total) by mouth daily. 08/13/22   Armbruster, Willaim Rayas, MD  Cholecalciferol (VITAMIN D) 125 MCG (5000 UT) CAPS Take 5000 IU of Vitamin D3 once daily 08/13/22   Armbruster, Willaim Rayas, MD  diltiazem 2 % GEL Apply a pea-sized amount to the rectum twice daily 10/02/21   Unk Lightning, PA  ferrous sulfate 325 (65 FE) MG EC tablet Take 1 tablet (325 mg total) by mouth daily with breakfast. 09/13/21   Unk Lightning, PA  Lidocaine, Anorectal, (RECTICARE) 5 % CREA Apply 1 Application topically as directed. 10/02/21   Unk Lightning, PA  nifedipine 0.3 % ointment APPLY TO ANUS FOUR TIMES A DAY FOR ONE MONTH 09/15/21   [provider]  predniSONE (DELTASONE) 10 MG tablet Take 4 tablets (40 mg total) by mouth daily with breakfast for 7 days, THEN 3.5 tablets (35 mg total) daily with breakfast for 7 days, THEN 3 tablets (30 mg total) daily with breakfast for 7 days, THEN 2.5 tablets (25 mg total) daily with breakfast for 7 days, THEN 2 tablets (20 mg total) daily with breakfast for 7 days, THEN 1.5 tablets (15 mg total) daily  with breakfast for 7 days, THEN 1 tablet (10 mg total) daily with breakfast for 7 days, THEN 0.5 tablets (5 mg total) daily with breakfast for 7 days. 08/28/22 10/23/22  Armbruster, Willaim Rayas, MD    Allergies Patient has no known allergies.   REVIEW OF SYSTEMS  Negative except as noted here or in the History of Present Illness.   PHYSICAL EXAMINATION  Initial Vital Signs Blood pressure 136/86, pulse (!) 58, temperature 98.7 F (37.1 C), temperature source Oral, resp. rate 16, height 6\' 3"  (1.905 m), weight 129.7 kg, SpO2 98 %.  Examination General: Well-developed, well-nourished male in no acute distress; appearance consistent with age of record HENT: normocephalic; atraumatic Eyes:  Normal appearance Neck: supple Heart: regular rate and rhythm Lungs: clear to auscultation bilaterally Abdomen: soft; nondistended; nontender; bowel sounds present Extremities: No deformity; full range of motion Neurologic: Awake, alert and oriented; motor function intact in all extremities and symmetric; no facial droop Skin: Warm and dry; urticarial rash of trunk Psychiatric: Normal mood and affect   RESULTS  Summary of this visit's results, reviewed and interpreted by myself:   EKG Interpretation  Date/Time:  Sunday September 23 2022 21:37:24 EDT Ventricular Rate:  67 PR Interval:  140 QRS Duration: 104 QT Interval:  382 QTC Calculation: 403 R Axis:   34 Text Interpretation: Normal sinus rhythm with sinus arrhythmia Normal ECG No previous ECGs available Confirmed by Paula Libra ([id number]) on 09/24/2022 12:37:25 AM       Laboratory Studies: Results for orders placed or performed during the hospital encounter of 09/23/22 (from the past 24 hour(s))  Basic metabolic panel     Status: None   Collection Time: 09/23/22  9:38 PM  Result Value Ref Range   Sodium 140 135 - 145 mmol/L   Potassium 3.6 3.5 - 5.1 mmol/L   Chloride 103 98 - 111 mmol/L   CO2 28 22 - 32 mmol/L   Glucose, Bld 95 70 - 99 mg/dL   BUN 13 6 - 20 mg/dL   Creatinine, Ser 6.96 0.61 - 1.24 mg/dL   Calcium 9.7 8.9 - 29.5 mg/dL   GFR, Estimated >28 >41 mL/min   Anion gap 9 5 - 15  CBC     Status: Abnormal   Collection Time: 09/23/22  9:38 PM  Result Value Ref Range   WBC 11.8 (H) 4.0 - 10.5 K/uL   RBC 5.47 4.22 - 5.81 MIL/uL   Hemoglobin 14.2 13.0 - 17.0 g/dL   HCT 32.4 40.1 - 02.7 %   MCV 78.1 (L) 80.0 - 100.0 fL   MCH 26.0 26.0 - 34.0 pg   MCHC 33.3 30.0 - 36.0 g/dL   RDW 25.3 66.4 - 40.3 %   Platelets 256 150 - 400 K/uL   nRBC 0.0 0.0 - 0.2 %  Troponin I (High Sensitivity)     Status: None   Collection Time: 09/23/22  9:38 PM  Result Value Ref Range   Troponin I (High Sensitivity) 3 <18 ng/L  Troponin I  (High Sensitivity)     Status: None   Collection Time: 09/24/22 12:51 AM  Result Value Ref Range   Troponin I (High Sensitivity) 3 <18 ng/L   Imaging Studies: DG Chest 2 View  Result Date: 09/23/2022 CLINICAL DATA:  CP EXAM: CHEST - 2 VIEW COMPARISON:  07/27/2019 FINDINGS: Lungs are clear.  No pneumothorax. Heart size and mediastinal contours are within normal limits. No effusion. Visualized bones unremarkable. IMPRESSION: No acute cardiopulmonary disease. Electronically Signed  By: Corlis Leak M.D.   On: 09/23/2022 22:06    ED COURSE and MDM  Nursing notes, initial and subsequent vitals signs, including pulse oximetry, reviewed and interpreted by myself.  Vitals:   09/23/22 2136 09/23/22 2137 09/23/22 2326  BP:  (!) 139/96 136/86  Pulse:  77 (!) 58  Resp:  17 16  Temp:  98.7 F (37.1 C)   TempSrc:  Oral   SpO2:  100% 98%  Weight: 129.7 kg    Height: 6\' 3"  (1.905 m)     Medications  diphenhydrAMINE (BENADRYL) capsule 50 mg (50 mg Oral Given 09/24/22 0048)  famotidine (PEPCID) tablet 20 mg (20 mg Oral Given 09/24/22 0059)   1:32 AM The cause of the patient's allergic reaction and the cause of the chest pain are unclear.  He was advised to take Benadryl 50 mg every 6 hours as needed for his symptoms.  His troponins are normal as is his EKG.  His HEART score is low.   PROCEDURES  Procedures   ED DIAGNOSES     ICD-10-CM   1. Allergic reaction, initial encounter  T78.40XA     2. Atypical chest pain  R07.89          Paula Libra, MD 09/24/22 [phone]

## 2022-09-25 ENCOUNTER — Telehealth: Payer: Self-pay | Admitting: Gastroenterology

## 2022-09-25 NOTE — Telephone Encounter (Signed)
PT is calling to further discuss disability information. Requesting call backdd

## 2022-09-25 NOTE — Telephone Encounter (Signed)
I can fill out the forms but they need to be clear exactly which dates they want off. I only went off what they had told me previously.  I would NOT change his infusion schedule. The medication lasts a long time and do not want to delay his dose, even though scheduled for 6/27 it will be in his system long enough for his trip. Interrupted or delayed dosing is more likely to cause problems and recommend he stay on the schedule that is outline for him. Thanks

## 2022-09-25 NOTE — Telephone Encounter (Signed)
Called and left message for patient that Dr. Adela Lank indicated the following regarding moving his infusion date:  "I would NOT change his infusion schedule. The medication lasts a long time and do not want to delay his dose, even though scheduled for 6/27 it will be in his system long enough for his trip. Interrupted or delayed dosing is more likely to cause problems and recommend he stay on the schedule that is outline for him".

## 2022-09-25 NOTE — Telephone Encounter (Signed)
Called and spoke to wife, Luanna Cole.  She indicates that the disability form Dr. Adela Lank completed only mentioned the dates he was in the hospital 4-27 thr 4-29.  Patient was also unable to work during the time period of 5-17 thru 6-2 due to dizziness, shortness of breath, rectal bleeding and abdominal pain. He returned to work on 09-10-22.  Patient requesting updated forms to reflect additional days he missed work.  We can add the additional dates and Dr. Adela Lank can sign and date if willing. Forms printed and placed on Dr. Lanetta Inch desk for review  Of note: He is going out of town for work for one month starting on July 5th.  He is scheduled for his next infusion on 6-27.  He would like to push it closer to 7-5 to insure he feels as well as he can while he is out of town. Patient was encouraged to call infusion center to reschedule appointment and let us know if he encounters any issues.

## 2022-09-26 NOTE — Telephone Encounter (Signed)
Called and spoke to patient's wife.  Disability form has been updated and faxed to [facility] [employer]. Form scanned to chart and mailed to patient.

## 2022-10-02 NOTE — Telephone Encounter (Signed)
[place], Unfortunately its still pending although I submitted the auth 2wks ago.  I have placed an urgent request.  He has an appt for 10/04/22 and I'm hopeful we will have a determination by tomorrow morning. I will f/u once I have a response from the insurance. Thanks

## 2022-10-03 ENCOUNTER — Encounter: Payer: Self-pay | Admitting: Gastroenterology

## 2022-10-03 NOTE — Telephone Encounter (Signed)
[place], Patient has been approved for Avsola 10mg /kg q4wks.    Auth Submission: APPROVED Site of care: Site of care: [facility] Payer: [employer] Medication & CPT/J Code(s) submitted: Avsola (infliximab-axxq) [phone] Route of submission (phone, fax, portal): PORTAL Phone # Fax # Auth type: Buy/Bill Units/visits requested: 10MG /KG Q4WKS Reference number: [id number] Approval from: 09/19/22 to 09/18/23

## 2022-10-03 NOTE — Telephone Encounter (Signed)
Great, thank you!

## 2022-10-04 ENCOUNTER — Ambulatory Visit (INDEPENDENT_AMBULATORY_CARE_PROVIDER_SITE_OTHER): Payer: 59

## 2022-10-04 VITALS — BP 110/71 | HR 60 | Temp 98.0°F | Resp 18 | Ht 75.0 in | Wt 288.6 lb

## 2022-10-04 DIAGNOSIS — K50113 Crohn's disease of large intestine with fistula: Secondary | ICD-10-CM | POA: Diagnosis not present

## 2022-10-04 MED ORDER — METHYLPREDNISOLONE SODIUM SUCC 40 MG IJ SOLR
40.0000 mg | Freq: Once | INTRAMUSCULAR | Status: AC
  Administered 2022-10-04: 40 mg via INTRAVENOUS
  Filled 2022-10-04: qty 1

## 2022-10-04 MED ORDER — ACETAMINOPHEN 325 MG PO TABS
650.0000 mg | ORAL_TABLET | Freq: Once | ORAL | Status: AC
  Administered 2022-10-04: 650 mg via ORAL
  Filled 2022-10-04: qty 2

## 2022-10-04 MED ORDER — DIPHENHYDRAMINE HCL 25 MG PO CAPS
25.0000 mg | ORAL_CAPSULE | Freq: Once | ORAL | Status: AC
  Administered 2022-10-04: 25 mg via ORAL
  Filled 2022-10-04: qty 1

## 2022-10-04 MED ORDER — SODIUM CHLORIDE 0.9 % IV SOLN
5.0000 mg/kg | INTRAVENOUS | Status: DC
  Administered 2022-10-04: 600 mg via INTRAVENOUS
  Filled 2022-10-04: qty 60

## 2022-10-04 NOTE — Progress Notes (Signed)
Diagnosis: Crohn's Disease  Provider:  Chilton Greathouse MD  Procedure: IV Infusion  IV Type: Peripheral, IV Location: R Upper Arm  Avsola (infliximab-axxq), Dose: 600 mg  Infusion Start Time: 1043  Infusion Stop Time: 1150  Post Infusion IV Care: Patient declined observation and Peripheral IV Discontinued  Discharge: Condition: Good, Destination: Home . AVS Declined  Performed by:  Marlow Baars [place], RN

## 2022-10-15 ENCOUNTER — Telehealth: Payer: Self-pay | Admitting: Pharmacy Technician

## 2022-10-15 NOTE — Telephone Encounter (Addendum)
CHANGE IN SITE: [facility]  Auth Submission: APPROVED Insurance has termed.  Spoke with Blakely Romack (wife) and they are awaiting new insurance info. Once new insurance info is received, I will submit a new auth.  Site of care: Site of care: [facility] APPROVED Payer: [employer] Medication & CPT/J Code(s) submitted: Avsola (infliximab-axxq) [id number] Route of submission (phone, fax, portal): PORTAL Phone # Fax # Auth type: Buy/Bill Units/visits requested: 10MG /KG 400MG  Q4WKS Reference number: [id number] Approval from:  11/12/22 - 11/12/23

## 2022-10-23 ENCOUNTER — Other Ambulatory Visit: Payer: Self-pay

## 2022-10-29 NOTE — Telephone Encounter (Signed)
Brett Canales, Yes, please enter new tx plan for his new insurance plan. Ty Selena Batten

## 2022-11-07 ENCOUNTER — Telehealth: Payer: Self-pay

## 2022-11-07 DIAGNOSIS — E559 Vitamin D deficiency, unspecified: Secondary | ICD-10-CM

## 2022-11-07 DIAGNOSIS — K50113 Crohn's disease of large intestine with fistula: Secondary | ICD-10-CM

## 2022-11-07 DIAGNOSIS — Z79899 Other long term (current) drug therapy: Secondary | ICD-10-CM

## 2022-11-07 NOTE — Telephone Encounter (Signed)
-----   Message from [facility] Marylu Lund H sent at 08/13/2022 11:25 AM EDT ----- Regarding: due for labs in early August Patient will be due for Vitamin D and IBC/ferritin in early August. Orders are in

## 2022-11-07 NOTE — Telephone Encounter (Signed)
MyChart message to patient to go to the lab 

## 2022-11-08 ENCOUNTER — Other Ambulatory Visit: Payer: Self-pay

## 2022-11-08 DIAGNOSIS — K50113 Crohn's disease of large intestine with fistula: Secondary | ICD-10-CM

## 2022-11-08 DIAGNOSIS — E559 Vitamin D deficiency, unspecified: Secondary | ICD-10-CM

## 2022-11-08 DIAGNOSIS — Z79899 Other long term (current) drug therapy: Secondary | ICD-10-CM

## 2022-11-08 NOTE — Progress Notes (Signed)
Labs (TIBC Ferritin and Vitamin D) faxed to [facility] in [place] at [phone]

## 2022-11-20 ENCOUNTER — Other Ambulatory Visit: Payer: Self-pay

## 2022-11-20 ENCOUNTER — Telehealth (HOSPITAL_COMMUNITY): Payer: Self-pay | Admitting: Pharmacy Technician

## 2022-11-20 ENCOUNTER — Encounter: Payer: Self-pay | Admitting: Gastroenterology

## 2022-11-20 NOTE — Telephone Encounter (Signed)
Auth Submission: APPROVED Site of care: Site of care: [facility] Payer: UHC Medication & CPT/J Code(s) submitted: AVSOLA Route of submission (phone, fax, portal): PORTAL  Auth type: Buy/Bill PB Units/visits requested: 1584 units Reference number: [id number] Approval from: 11/12/22 to 10/07/23   Auth approved through 11/12/23, however only approved for 1584 units. Patients will use up 1560 units by 10/07/23, therefore apply for renewal after 12 doses complete.

## 2022-11-21 ENCOUNTER — Ambulatory Visit: Payer: Self-pay | Admitting: Allergy

## 2022-11-23 ENCOUNTER — Encounter: Payer: 59 | Attending: Family Medicine | Admitting: *Deleted

## 2022-11-23 VITALS — BP 106/67 | HR 50 | Temp 97.8°F | Resp 16 | Ht 75.0 in | Wt 290.4 lb

## 2022-11-23 DIAGNOSIS — K50113 Crohn's disease of large intestine with fistula: Secondary | ICD-10-CM

## 2022-11-23 MED ORDER — DIPHENHYDRAMINE HCL 25 MG PO CAPS
25.0000 mg | ORAL_CAPSULE | Freq: Once | ORAL | Status: AC
  Administered 2022-11-23: 25 mg via ORAL
  Filled 2022-11-23: qty 1

## 2022-11-23 MED ORDER — ACETAMINOPHEN 325 MG PO TABS
650.0000 mg | ORAL_TABLET | Freq: Once | ORAL | Status: AC
  Administered 2022-11-23: 650 mg via ORAL
  Filled 2022-11-23: qty 2

## 2022-11-23 MED ORDER — METHYLPREDNISOLONE SODIUM SUCC 40 MG IJ SOLR
40.0000 mg | Freq: Once | INTRAMUSCULAR | Status: AC
  Administered 2022-11-23: 40 mg via INTRAVENOUS
  Filled 2022-11-23: qty 1

## 2022-11-23 MED ORDER — SODIUM CHLORIDE 0.9 % IV SOLN
10.0000 mg/kg | Freq: Once | INTRAVENOUS | Status: AC
  Administered 2022-11-23: 1300 mg via INTRAVENOUS
  Filled 2022-11-23: qty 130

## 2022-11-23 NOTE — Progress Notes (Signed)
Diagnosis: Crohn's Disease  Provider:   Ileene Patrick MD  Procedure: IV Infusion  IV Type: Peripheral, IV Location: R Antecubital  Avsola (infliximab-axxq), Dose: 1300 mg  Infusion Start Time: 1049 am  Infusion Stop Time: 1245 pm  Post Infusion IV Care: Observation period completed and Peripheral IV Discontinued  Discharge: Condition: Good, Destination: Home . AVS Declined  Performed by:  Forrest Moron, RN

## 2022-12-18 ENCOUNTER — Encounter: Payer: Self-pay | Admitting: Gastroenterology

## 2022-12-29 LAB — IRON,TIBC AND FERRITIN PANEL
Ferritin: 60 ng/mL (ref 30–400)
Iron Saturation: 18 % (ref 15–55)
Iron: 56 ug/dL (ref 38–169)
Total Iron Binding Capacity: 318 ug/dL (ref 250–450)
UIBC: 262 ug/dL (ref 111–343)

## 2022-12-29 LAB — VITAMIN D 25 HYDROXY (VIT D DEFICIENCY, FRACTURES): Vit D, 25-Hydroxy: 34.2 ng/mL (ref 30.0–100.0)

## 2023-01-02 ENCOUNTER — Encounter: Payer: Self-pay | Admitting: Gastroenterology

## 2023-01-02 ENCOUNTER — Encounter: Payer: 59 | Attending: Family Medicine | Admitting: Internal Medicine

## 2023-01-02 ENCOUNTER — Other Ambulatory Visit (INDEPENDENT_AMBULATORY_CARE_PROVIDER_SITE_OTHER): Payer: 59

## 2023-01-02 ENCOUNTER — Ambulatory Visit: Payer: 59 | Admitting: Gastroenterology

## 2023-01-02 VITALS — BP 136/80 | HR 100 | Ht 75.0 in | Wt 301.0 lb

## 2023-01-02 VITALS — BP 100/75 | HR 58 | Temp 97.8°F | Resp 16

## 2023-01-02 DIAGNOSIS — K50113 Crohn's disease of large intestine with fistula: Secondary | ICD-10-CM | POA: Insufficient documentation

## 2023-01-02 DIAGNOSIS — Z79899 Other long term (current) drug therapy: Secondary | ICD-10-CM

## 2023-01-02 DIAGNOSIS — E559 Vitamin D deficiency, unspecified: Secondary | ICD-10-CM

## 2023-01-02 LAB — IBC + FERRITIN
Ferritin: 35 ng/mL (ref 22.0–322.0)
Iron: 58 ug/dL (ref 42–165)
Saturation Ratios: 16.8 % — ABNORMAL LOW (ref 20.0–50.0)
TIBC: 344.4 ug/dL (ref 250.0–450.0)
Transferrin: 246 mg/dL (ref 212.0–360.0)

## 2023-01-02 LAB — CBC WITH DIFFERENTIAL/PLATELET
Basophils Absolute: 0 10*3/uL (ref 0.0–0.1)
Basophils Relative: 0.1 % (ref 0.0–3.0)
Eosinophils Absolute: 0 10*3/uL (ref 0.0–0.7)
Eosinophils Relative: 0.1 % (ref 0.0–5.0)
HCT: 44.8 % (ref 39.0–52.0)
Hemoglobin: 14.8 g/dL (ref 13.0–17.0)
Lymphocytes Relative: 5.9 % — ABNORMAL LOW (ref 12.0–46.0)
Lymphs Abs: 0.6 10*3/uL — ABNORMAL LOW (ref 0.7–4.0)
MCHC: 33 g/dL (ref 30.0–36.0)
MCV: 78.9 fl (ref 78.0–100.0)
Monocytes Absolute: 0.2 10*3/uL (ref 0.1–1.0)
Monocytes Relative: 1.7 % — ABNORMAL LOW (ref 3.0–12.0)
Neutro Abs: 9.1 10*3/uL — ABNORMAL HIGH (ref 1.4–7.7)
Neutrophils Relative %: 92.2 % — ABNORMAL HIGH (ref 43.0–77.0)
Platelets: 257 10*3/uL (ref 150.0–400.0)
RBC: 5.68 Mil/uL (ref 4.22–5.81)
RDW: 13.7 % (ref 11.5–15.5)
WBC: 9.9 10*3/uL (ref 4.0–10.5)

## 2023-01-02 LAB — COMPREHENSIVE METABOLIC PANEL
ALT: 43 U/L (ref 0–53)
AST: 23 U/L (ref 0–37)
Albumin: 4.5 g/dL (ref 3.5–5.2)
Alkaline Phosphatase: 88 U/L (ref 39–117)
BUN: 18 mg/dL (ref 6–23)
CO2: 27 mEq/L (ref 19–32)
Calcium: 9.9 mg/dL (ref 8.4–10.5)
Chloride: 102 mEq/L (ref 96–112)
Creatinine, Ser: 1.18 mg/dL (ref 0.40–1.50)
GFR: 82.56 mL/min (ref >60.00)
Glucose, Bld: 168 mg/dL — ABNORMAL HIGH (ref 70–99)
Potassium: 4.5 mEq/L (ref 3.5–5.1)
Sodium: 136 mEq/L (ref 135–145)
Total Bilirubin: 0.6 mg/dL (ref 0.2–1.2)
Total Protein: 7.9 g/dL (ref 6.0–8.3)

## 2023-01-02 LAB — VITAMIN D 25 HYDROXY (VIT D DEFICIENCY, FRACTURES): VITD: 21.34 ng/mL — ABNORMAL LOW (ref 30.00–100.00)

## 2023-01-02 MED ORDER — METHYLPREDNISOLONE SODIUM SUCC 40 MG IJ SOLR
40.0000 mg | Freq: Once | INTRAMUSCULAR | Status: AC
  Administered 2023-01-02: 40 mg via INTRAVENOUS
  Filled 2023-01-02: qty 1

## 2023-01-02 MED ORDER — SODIUM CHLORIDE 0.9 % IV SOLN
1300.0000 mg | Freq: Once | INTRAVENOUS | Status: AC
  Administered 2023-01-02: 1300 mg via INTRAVENOUS
  Filled 2023-01-02: qty 130

## 2023-01-02 MED ORDER — ACETAMINOPHEN 325 MG PO TABS
650.0000 mg | ORAL_TABLET | Freq: Once | ORAL | Status: AC
  Administered 2023-01-02: 650 mg via ORAL
  Filled 2023-01-02: qty 2

## 2023-01-02 MED ORDER — DIPHENHYDRAMINE HCL 25 MG PO CAPS
25.0000 mg | ORAL_CAPSULE | Freq: Once | ORAL | Status: AC
  Administered 2023-01-02: 25 mg via ORAL
  Filled 2023-01-02: qty 1

## 2023-01-02 NOTE — Patient Instructions (Addendum)
Please go to the lab in the basement of our building to have lab work done as you leave today. Hit "B" for basement when you get on the elevator.  When the doors open the lab is on your left.  We will call you with the results. Thank you.  Please plan to go to the lab the morning of October 23rd, before your next infusion of Avsola.  Please follow up in 6 months.  We are giving you a letter to take to the pharmacy to get the following vaccines:  PCV21, Shingrix and flu shot  Thank you for entrusting me with your care and for choosing [facility], Dr. Ileene Patrick    If your blood pressure at your visit was 140/90 or greater, please contact your primary care physician to follow up on this. ______________________________________________________  If you are age 68 or older, your body mass index should be between 23-30. Your Body mass index is 37.62 kg/m. If this is out of the aforementioned range listed, please consider follow up with your Primary Care Provider.  If you are age 55 or younger, your body mass index should be between 19-25. Your Body mass index is 37.62 kg/m. If this is out of the aformentioned range listed, please consider follow up with your Primary Care Provider.  ________________________________________________________  The [facility] providers would like to encourage you to use [facility] to communicate with providers for non-urgent requests or questions.  Due to long hold times on the telephone, sending your provider a message by [facility] may be a faster and more efficient way to get a response.  Please allow 48 business hours for a response.  Please remember that this is for non-urgent requests.  _______________________________________________________  Due to recent changes in healthcare laws, you may see the results of your imaging and laboratory studies on MyChart before your provider has had a chance to review them.  We understand that in some cases there may be  results that are confusing or concerning to you. Not all laboratory results come back in the same time frame and the provider may be waiting for multiple results in order to interpret others.  Please give [place] 48 hours in order for your provider to thoroughly review all the results before contacting the office for clarification of your results.

## 2023-01-02 NOTE — Progress Notes (Signed)
HPI :  32 y/o male here for a follow up visit for Crohn's disease.    Crohn's history: Dx with Crohn's colitis complicated by perianal fistula, in June 2023.  Colonoscopy confirmed colonic inflammation in the distal left colon, associated with perianal fistulas on MRI of the pelvis.  We had discussed options, I placed him on Avsola and Imuran following initial workup.    SINCE LAST VISIT:  We started this in late June/July.  We had intended for close follow-up but he was lost to follow-up and took Imuran for period of time but then ran out and stopped it.  He reestablished care with me this past May.  He had continued Avsola every 8 weeks but within 1 to 2 weeks before his infusion he was having recurrent rectal bleeding with increased stool frequency and cramping, associate with some perianal pain. I checked his infliximab level in light of the symptoms and level was undetectable with an antibody level of 58 (low level antibody).  Fecal calprotectin was 173 at that time and C. difficile was negative.  We discussed options and elected to increase his Avsola to 10 mg/kg every 4 weeks and we added Imuran back to his regimen.  He is here for follow-up today.  He states he is doing really well since have last seen him and the adjustments were made.  He is having no breakthrough symptoms anymore.  He is feeling great.  No perianal symptoms of bother him.  He is having 1 bowel movement per day.  No blood in his stools.  No abdominal pains.  He is compliant with his Imuran and Avsola, he had an infusion this morning, states he tolerates it well.  We discussed long-term plan.  Had previously discussed getting pneumonia vaccine and shingles vaccine, he has not done that yet.  We discussed healthcare maintenance issues on the regimen.    Quantiferon gold negative 08/10/22  08/14/32 - fecal calprotectin 173 08/31/22 - C diff negative 09/06/22 - infliximab level < 0.4, with antibody level of 58  Avsola  increased to 10mg /kg every 4 weeks and Imuran added back Labs 12/28/22 - vitamin D 34.2, iron studies normal   MRI pelvis 09/12/21: IMPRESSION: 1. 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 One of these appears to be blind-ending in the other to extend towards and possibly reach the gluteal cleft. 2. Enlarged perirectal lymph nodes. Mildly enlarged left external iliac node. These may be reactive. 3. Mild presacral edema. 4. No drainable abscess observed.     Colonoscopy 09/28/21: - The perianal and digital rectal examinations were normal. - The terminal ileum appeared normal. - A diminutive polyp was found in the rectum. The polyp was sessile. The polyp was removed with a cold biopsy forceps. Resection and retrieval were complete. - Patchy mild inflammation characterized by erosions, erythema, granularity and loss of vascularity was found in the distal rectum, in the sigmoid colon, in the descending colon, at the splenic flexure, in the distal transverse colon and in the cecum. Most affected area was mid sigmoid colon. Biopsies were taken with a cold forceps for histology from the right, transverse, and left colon. - Internal hemorrhoids were found during retroflexion. The hemorrhoids were small. - The exam was otherwise without abnormality     1. Surgical [P], right colon - UNREMARKABLE COLONIC MUCOSA. - NO ACTIVE INFLAMMATION OR CHRONIC CHANGES. - NEGATIVE FOR DYSPLASIA. 2. Surgical [P], colon, transverse - MILDLY ACTIVE CHRONIC COLITIS WITH GRANULOMAS CONSISTENT WITH CROHN'S. -  NEGATIVE FOR DYSPLASIA. 3. Surgical [P], left colon biopsy - FOCAL ACTIVE COLITIS WITH FOCAL EROSION. - NEGATIVE FOR DYSPLASIA. 4. Surgical [P], colon, rectum, polyp (1) - INFLAMMATORY POLYP. - NEGATIVE FOR DYSPLASIA.     Past Medical History:  Diagnosis Date   Asthma    07/27/2019: per patient "as a kid"   Crohn's disease (HCC)    colonic Crohn's with perianal fistula     Past  Surgical History:  Procedure Laterality Date   LUMBAR LAMINECTOMY/DECOMPRESSION MICRODISCECTOMY Right 07/30/2019   Procedure: Right L5-S1disectomy;  Surgeon: Venita Lick, MD;  Location: [facility] OR;  Service: Orthopedics;  Laterality: Right;  2.5hrs   TONSILLECTOMY     Family History  Problem Relation Age of Onset   Colon cancer Neg Hx    Stomach cancer Neg Hx    Esophageal cancer Neg Hx    Colon polyps Neg Hx    Rectal cancer Neg Hx    Social History   Tobacco Use   Smoking status: Never   Smokeless tobacco: Former    Types: Chew    Quit date: 04/28/2019  Vaping Use   Vaping status: Never Used  Substance Use Topics   Alcohol use: Yes    Comment: 07/27/2019: occassionally    Drug use: Never   Current Outpatient Medications  Medication Sig Dispense Refill   azathioprine (IMURAN) 100 MG tablet Take 2.5 tablets (250 mg total) by mouth daily. 75 tablet 1   Cholecalciferol (VITAMIN D) 125 MCG (5000 UT) CAPS Take 5000 IU of Vitamin D3 once daily 30 capsule 0   diltiazem 2 % GEL Apply a pea-sized amount to the rectum twice daily 30 g 0   EPINEPHrine 0.3 mg/0.3 mL IJ SOAJ injection Self inject per package instructions as needed for severe allergic reaction and seek medical attention. 2 each 0   ferrous sulfate 325 (65 FE) MG EC tablet Take 1 tablet (325 mg total) by mouth daily with breakfast. 30 tablet 0   Lidocaine, Anorectal, (RECTICARE) 5 % CREA Apply 1 Application topically as directed. (Patient not taking: Reported on 01/02/2023) 15 g 0   nifedipine 0.3 % ointment APPLY TO ANUS FOUR TIMES A DAY FOR ONE MONTH (Patient not taking: Reported on 01/02/2023)     No current facility-administered medications for this visit.   No Known Allergies   Review of Systems: All systems reviewed and negative except where noted in HPI.   Labs reviewed in Epic  Physical Exam: BP 136/80   Pulse 100   Ht 6\' 3"  (1.905 m)   Wt (!) 301 lb (136.5 kg)   BMI 37.62 kg/m  Constitutional:  Pleasant,well-developed, male in no acute distress. Neurological: Alert and oriented to person place and time. Psychiatric: Normal mood and affect. Behavior is normal.   ASSESSMENT: 31 y.o. male here for assessment of the following  1. Crohn's disease of large intestine with fistula (HCC)   2. High risk medication use    Colonic Crohn's disease complicated by perianal fistula.  Has responded to Avsola, recall he had some breakthrough on Avsola monotherapy (after he ran out of Imuran and had not followed up for a period of time).  We found his Avsola levels to be undetectable with a low level antibody.  In response to this we increased his Avsola 10 mg/kg every 4 weeks and add back azathioprine 250 mg daily.  Since we have taken these measures he is feeling quite well and in clinical remission without any symptoms of bother him.  We discussed Crohn's disease, long-term plan and regimen.  He understands risks of this regimen to include immunosuppression, risk of lymphoma, etc.  He wishes to continue for now.  I will check his baseline labs today to make sure stable and we will check fecal calprotectin to ensure this is trending in the right direction and correlates with his clinical course.  Given we made some significant changes to his Avsola and added Imuran, I would like to repeat infliximab trough level and antibody trough level just prior to his next infusion.  He is agreeable with this.  In light of risks of immunosuppression, recommend pneumococcal vaccine, as well as Shingrix vaccine, as well as seasonal flu shot.  He is agreeable to these.  Wrote him prescription for the vaccinations.  I would like to see him in 6 months for reassessment, or sooner with any issues.  He agrees  PLAN: - continue Avsola 10mg  / kg every 4 weeks and Azathioprine 250mg  - lab today - CBC, CMET - lab for fecal calprotectin - lab for infliximab trough level and antibody trough level just prior to next infusion -  prescription for PCV 21 vaccine, Shingrix - recommend flu shot - f/u 6 months or sooner with issues  Harlin Rain, MD [facility]

## 2023-01-02 NOTE — Progress Notes (Signed)
Diagnosis: Crohn's Disease  Provider:   Ileene Patrick  Procedure: IV Infusion  IV Type: Peripheral, IV Location: L Antecubital  Avsola (infliximab-axxq), Dose: 1,300mg   Infusion Start Time: 0944  Infusion Stop Time: 1126  Post Infusion IV Care: Observation period completed  Discharge: Condition: Good, Destination: Home . AVS Provided  Performed by:  Feliberto Harts, LPN

## 2023-01-03 LAB — INFLIXIMAB+AB (SERIAL MONITOR)

## 2023-01-21 ENCOUNTER — Telehealth: Payer: Self-pay

## 2023-01-21 DIAGNOSIS — K50113 Crohn's disease of large intestine with fistula: Secondary | ICD-10-CM

## 2023-01-21 DIAGNOSIS — Z79899 Other long term (current) drug therapy: Secondary | ICD-10-CM

## 2023-01-21 NOTE — Telephone Encounter (Signed)
Lab entered for Infliximab trough level to be drawn late on Oct 22nd or before infusion on the 23rd.

## 2023-01-21 NOTE — Telephone Encounter (Signed)
-----   Message from [facility] [place] [facility] sent at 01/02/2023  5:25 PM EDT ----- Regarding: infliximab trough order Enter lab for Infliximab trough.  Patient's next infusion is Oct 23rd. He may come late in the day on Oct 22 or before infusion on the morning of Oct 23rd.

## 2023-01-30 ENCOUNTER — Ambulatory Visit: Payer: 59

## 2023-02-07 IMAGING — MR MR PELVIS WO/W CM
4 of 10 series · 18 of 48 positions shown · IV contrast (gadavist)
Comparison: None Available.

CLINICAL DATA: Rectal abscess.  Diarrhea and hematochezia.

EXAM:
MRI PELVIS WITHOUT AND WITH CONTRAST
TECHNIQUE: Multiplanar multisequence MR imaging of the pelvis was performed
both before and after administration of intravenous contrast.
Perianal fistula protocol was utilized.
CONTRAST:  10mL GADAVIST GADOBUTROL 1 MMOL/ML IV SOLN

[Series 2: T2 · sagittal · 2.5mm · 0.51mm/px · 5 of 69 slices shown (1 of 2)]
[im 1/69]
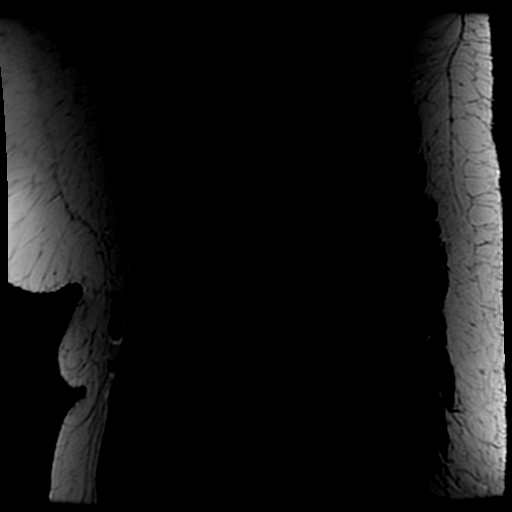
[im 18/69]
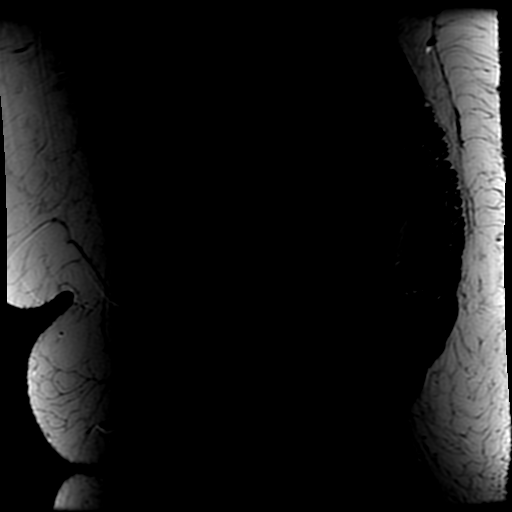
[im 35/69]
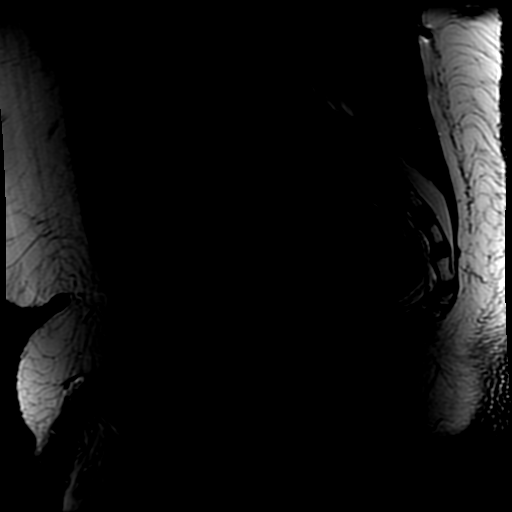
[im 52/69]
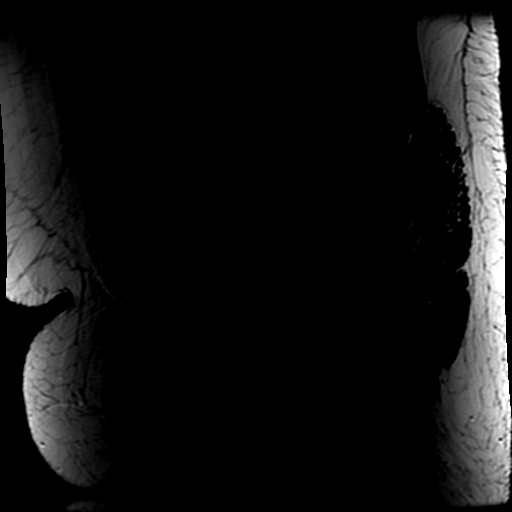
[im 69/69]
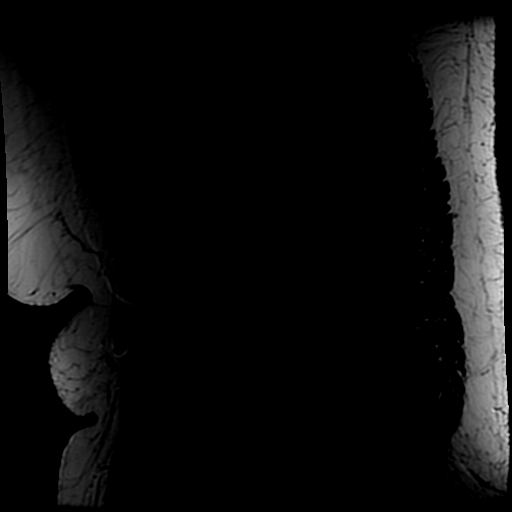

[Series 3: T2 fat-sat · sagittal · 2.5mm · 0.51mm/px · 5 of 69 slices shown (1 of 2)]
[im 1/69]
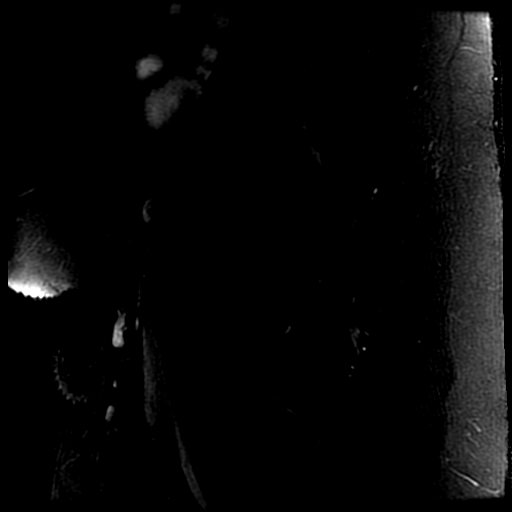
[im 18/69]
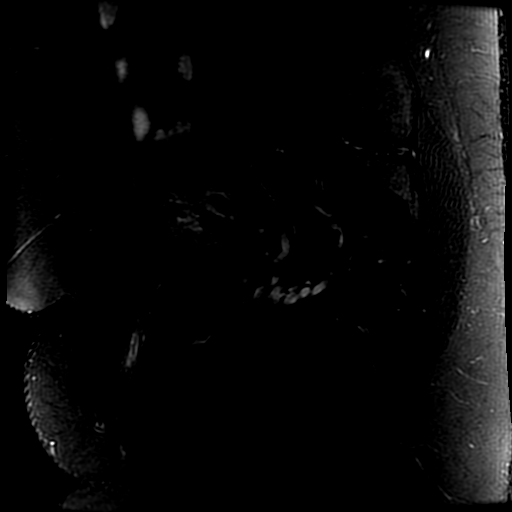
[im 35/69]
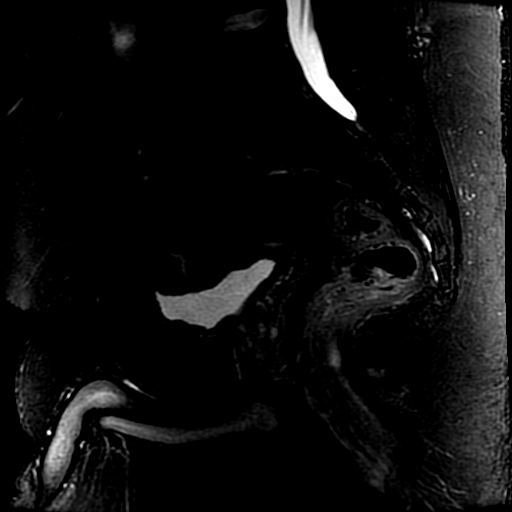
[im 52/69]
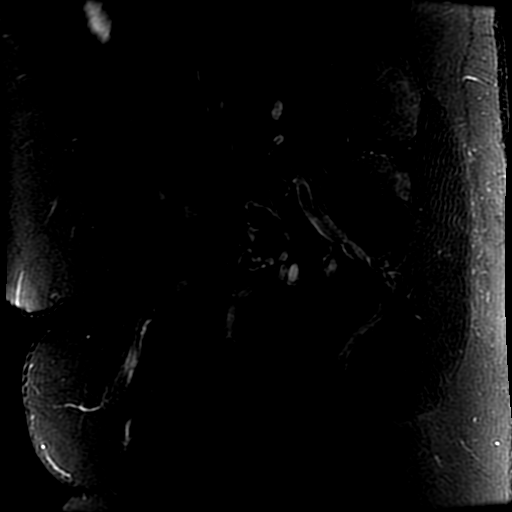
[im 69/69]
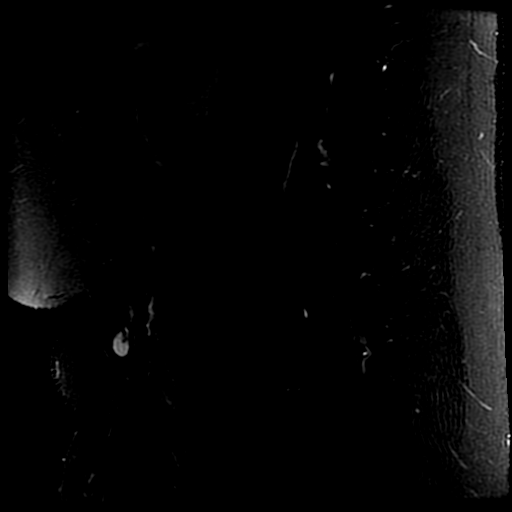

[Series 4: T2 · oblique · 4.0mm · 0.43mm/px · 2 of 33 slices shown (2 of 2)]
[im 1/33]
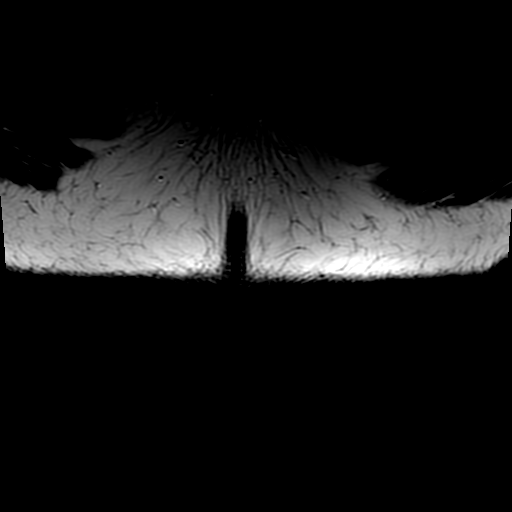
[im 33/33]
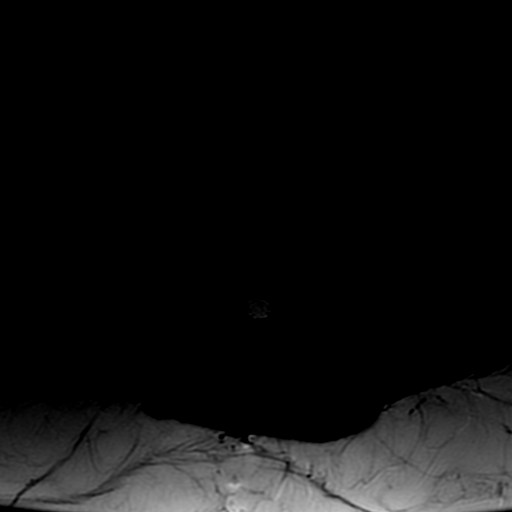

[Series 5: T2 fat-sat · sagittal · 2.5mm · 0.51mm/px · 6 of 69 slices shown (2 of 2)]
[im 1/69]
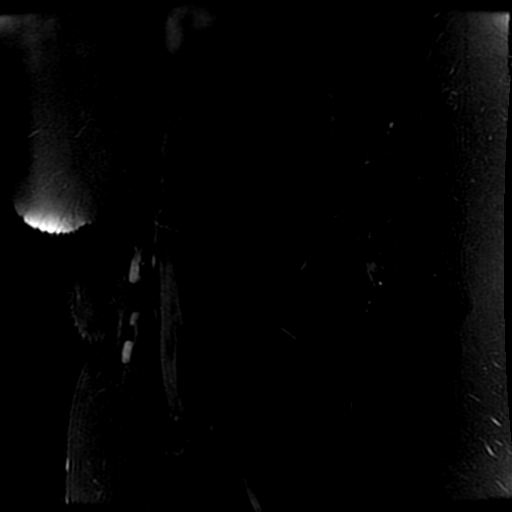
[im 14/69]
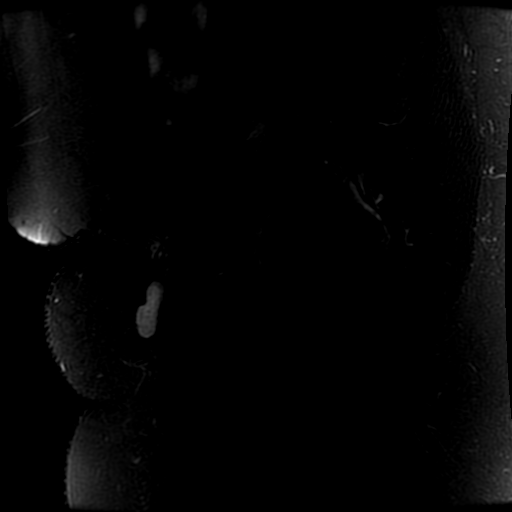
[im 28/69]
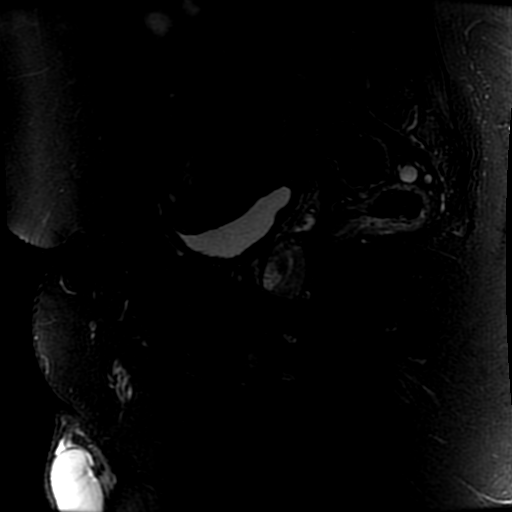
[im 41/69]
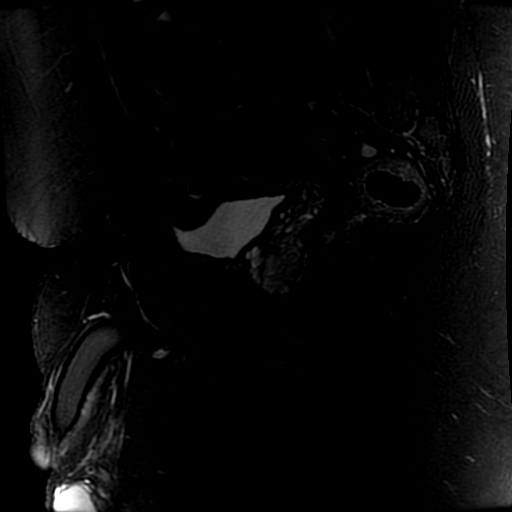
[im 55/69]
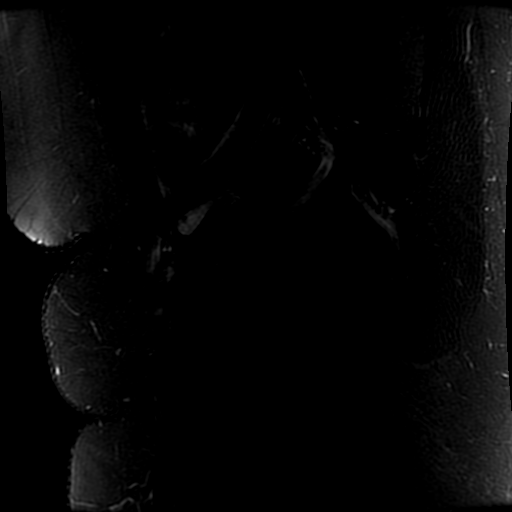
[im 69/69]
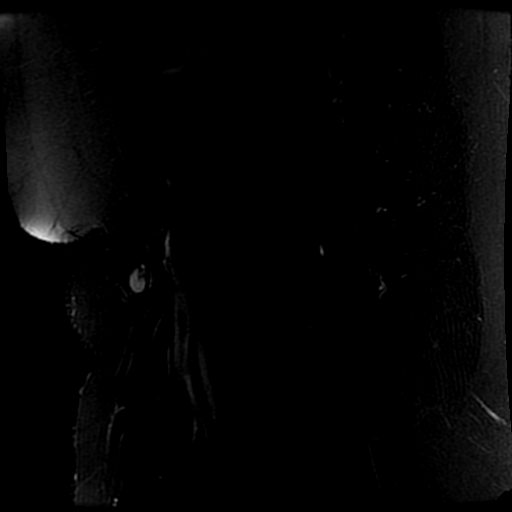

[18 of 48 positions shown; findings below may reference images not displayed]

FINDINGS: Rectal region: Multiple enlarged perirectal lymph nodes are present
including a 0.9 cm perirectal node on image 12 of series 6 and a
cm perirectal lymph node on image 9 of series 6, among others.

On images 31 through 25 of series series 6 there appears to be a
small blind-ending intersphincteric perianal fistula extending from
the 6 o'clock position around to the left lateral side of the anal
canal, terminating at about the level of the anorectal junction.

On images 40 through 57 of series 6 there appears to be a small
right eccentric perirectal intersphincteric fistula extending from
the 9 o'clock position caudad. Distal extent difficult to observed
but potentially extending to the gluteal cleft.

On images 38 through 63 of series 6, there appears to be a third
potential perianal fistula extending from the 1 o'clock position
caudad in the intersphincteric region, probably extending to the
gluteal cleft. This likely also has a small component extending
cephalad the 1 o'clock position.

Mild presacral edema is observed as on image 32 series 3.

Osseous structures: 3 mm of grade 1 degenerative retrolisthesis at
L5-S1 with disc desiccation loss of disc height at the L5-S1 level.

Other: 1.1 cm left external iliac node, image 1 series 6.
IMPRESSION: 1. Three small intersphincteric perianal fistulas are observed. One
of these appears to be blind-ending in the other to extend towards
and possibly reach the gluteal cleft.
2. Enlarged perirectal lymph nodes. Mildly enlarged left external
iliac node. These may be reactive.
3. Mild presacral edema.
4. No drainable abscess observed.

## 2023-02-08 ENCOUNTER — Encounter: Payer: Self-pay | Admitting: Gastroenterology

## 2023-02-08 DIAGNOSIS — K50113 Crohn's disease of large intestine with fistula: Secondary | ICD-10-CM

## 2023-02-08 DIAGNOSIS — Z79899 Other long term (current) drug therapy: Secondary | ICD-10-CM

## 2023-02-15 ENCOUNTER — Encounter: Payer: 59 | Attending: Family Medicine | Admitting: Internal Medicine

## 2023-02-15 VITALS — BP 113/64 | HR 66 | Temp 97.9°F | Resp 16 | Wt 295.0 lb

## 2023-02-15 DIAGNOSIS — K50113 Crohn's disease of large intestine with fistula: Secondary | ICD-10-CM | POA: Insufficient documentation

## 2023-02-15 MED ORDER — SODIUM CHLORIDE 0.9 % IV SOLN
10.0000 mg/kg | Freq: Once | INTRAVENOUS | Status: AC
  Administered 2023-02-15: 1300 mg via INTRAVENOUS
  Filled 2023-02-15: qty 130

## 2023-02-15 MED ORDER — DIPHENHYDRAMINE HCL 25 MG PO CAPS
25.0000 mg | ORAL_CAPSULE | Freq: Once | ORAL | Status: AC
  Administered 2023-02-15: 25 mg via ORAL
  Filled 2023-02-15: qty 1

## 2023-02-15 MED ORDER — METHYLPREDNISOLONE SODIUM SUCC 40 MG IJ SOLR
40.0000 mg | Freq: Once | INTRAMUSCULAR | Status: AC
  Administered 2023-02-15: 40 mg via INTRAVENOUS
  Filled 2023-02-15: qty 1

## 2023-02-15 MED ORDER — ACETAMINOPHEN 325 MG PO TABS
650.0000 mg | ORAL_TABLET | Freq: Once | ORAL | Status: AC
  Administered 2023-02-15: 650 mg via ORAL
  Filled 2023-02-15: qty 2

## 2023-02-15 NOTE — Progress Notes (Signed)
Diagnosis: Crohn's Disease  Provider:   Ileene Patrick, MD  Procedure: IV Infusion  IV Type: Peripheral, IV Location: R Antecubital  Avsola (infliximab-axxq), Dose: 1300mg   Infusion Start Time: 1042  Infusion Stop Time: 1203  Post Infusion IV Care: Observation period completed  Discharge: Condition: Good, Destination: Home . AVS Provided  Performed by:  Feliberto Harts, LPN

## 2023-03-01 LAB — INFLIXIMAB+AB (SERIAL MONITOR)
Anti-Infliximab Antibody: <22 ng/mL
Infliximab Drug Level: 9.5 ug/mL

## 2023-03-01 LAB — SERIAL MONITORING

## 2023-03-15 ENCOUNTER — Ambulatory Visit: Payer: 59

## 2023-04-01 ENCOUNTER — Encounter: Payer: 59 | Attending: Family Medicine | Admitting: Internal Medicine

## 2023-04-01 VITALS — BP 118/72 | HR 64 | Temp 98.0°F | Resp 16

## 2023-04-01 DIAGNOSIS — K50113 Crohn's disease of large intestine with fistula: Secondary | ICD-10-CM

## 2023-04-01 MED ORDER — ACETAMINOPHEN 325 MG PO TABS
650.0000 mg | ORAL_TABLET | Freq: Once | ORAL | Status: AC
  Administered 2023-04-01: 650 mg via ORAL
  Filled 2023-04-01: qty 2

## 2023-04-01 MED ORDER — SODIUM CHLORIDE 0.9 % IV SOLN
1300.0000 mg | Freq: Once | INTRAVENOUS | Status: AC
Start: 2023-04-01 — End: 2023-04-01
  Administered 2023-04-01: 1300 mg via INTRAVENOUS
  Filled 2023-04-01: qty 130

## 2023-04-01 MED ORDER — DIPHENHYDRAMINE HCL 25 MG PO CAPS
25.0000 mg | ORAL_CAPSULE | Freq: Once | ORAL | Status: AC
  Administered 2023-04-01: 25 mg via ORAL
  Filled 2023-04-01: qty 1

## 2023-04-01 MED ORDER — METHYLPREDNISOLONE SODIUM SUCC 40 MG IJ SOLR
40.0000 mg | Freq: Once | INTRAMUSCULAR | Status: AC
  Administered 2023-04-01: 40 mg via INTRAVENOUS
  Filled 2023-04-01: qty 1

## 2023-04-01 NOTE — Progress Notes (Signed)
Diagnosis: Crohn's Disease  Provider:  Ileene Patrick MD  Procedure: IV Infusion  IV Type: Peripheral, IV Location: R Antecubital  Avsola (infliximab-axxq), Dose: 1300mg   Infusion Start Time: 1340  Infusion Stop Time: 1507  Post Infusion IV Care: Observation period completed  Discharge: Condition: Good, Destination: Home . AVS Provided  Performed by:  Cleotilde Neer, LPN

## 2023-04-29 ENCOUNTER — Ambulatory Visit: Payer: 59

## 2023-05-06 ENCOUNTER — Encounter: Payer: Self-pay | Admitting: Gastroenterology

## 2023-06-13 ENCOUNTER — Encounter: Payer: 59 | Attending: Family Medicine | Admitting: Internal Medicine

## 2023-06-13 ENCOUNTER — Other Ambulatory Visit: Payer: Self-pay | Admitting: Emergency Medicine

## 2023-06-13 VITALS — BP 123/71 | HR 79 | Temp 98.9°F | Resp 16 | Wt 295.0 lb

## 2023-06-13 DIAGNOSIS — K50113 Crohn's disease of large intestine with fistula: Secondary | ICD-10-CM | POA: Insufficient documentation

## 2023-06-13 MED ORDER — METHYLPREDNISOLONE SODIUM SUCC 40 MG IJ SOLR
40.0000 mg | Freq: Once | INTRAMUSCULAR | Status: AC
  Administered 2023-06-13: 40 mg via INTRAVENOUS
  Filled 2023-06-13: qty 1

## 2023-06-13 MED ORDER — SODIUM CHLORIDE 0.9 % IV SOLN
10.0000 mg/kg | Freq: Once | INTRAVENOUS | Status: AC
  Administered 2023-06-13: 1300 mg via INTRAVENOUS
  Filled 2023-06-13: qty 130

## 2023-06-13 MED ORDER — DIPHENHYDRAMINE HCL 25 MG PO CAPS
25.0000 mg | ORAL_CAPSULE | Freq: Once | ORAL | Status: AC
  Administered 2023-06-13: 25 mg via ORAL
  Filled 2023-06-13: qty 1

## 2023-06-13 MED ORDER — ACETAMINOPHEN 325 MG PO TABS
650.0000 mg | ORAL_TABLET | Freq: Once | ORAL | Status: AC
  Administered 2023-06-13: 650 mg via ORAL
  Filled 2023-06-13: qty 2

## 2023-06-13 NOTE — Patient Instructions (Signed)
[id number]  

## 2023-06-13 NOTE — Progress Notes (Signed)
 Diagnosis: Crohn's Disease  Provider:   Ileene Patrick, MD  Procedure: IV Infusion  IV Type: Peripheral, IV Location: L Antecubital  Avsola (infliximab-axxq), Dose: 1300 mg  Infusion Start Time: 1325  Infusion Stop Time: 1438  Post Infusion IV Care: Observation period completed  Discharge: Condition: Good, Destination: Home . AVS Provided  Performed by:  Cleotilde Neer, LPN

## 2023-07-11 ENCOUNTER — Encounter: Attending: Family Medicine | Admitting: Internal Medicine

## 2023-07-11 VITALS — BP 111/68 | HR 65 | Temp 98.2°F | Resp 16

## 2023-07-11 DIAGNOSIS — K50113 Crohn's disease of large intestine with fistula: Secondary | ICD-10-CM

## 2023-07-11 MED ORDER — SODIUM CHLORIDE 0.9 % IV SOLN
1300.0000 mg | Freq: Once | INTRAVENOUS | Status: AC
  Administered 2023-07-11: 1300 mg via INTRAVENOUS
  Filled 2023-07-11: qty 130

## 2023-07-11 MED ORDER — METHYLPREDNISOLONE SODIUM SUCC 40 MG IJ SOLR
40.0000 mg | Freq: Once | INTRAMUSCULAR | Status: AC
  Administered 2023-07-11: 40 mg via INTRAVENOUS

## 2023-07-11 MED ORDER — ACETAMINOPHEN 325 MG PO TABS
650.0000 mg | ORAL_TABLET | Freq: Once | ORAL | Status: AC
Start: 2023-07-11 — End: 2023-07-11
  Administered 2023-07-11: 650 mg via ORAL

## 2023-07-11 MED ORDER — DIPHENHYDRAMINE HCL 25 MG PO CAPS
25.0000 mg | ORAL_CAPSULE | Freq: Once | ORAL | Status: AC
  Administered 2023-07-11: 25 mg via ORAL

## 2023-07-11 NOTE — Progress Notes (Signed)
 Diagnosis: Crohn's Disease  Provider:  Ileene Patrick MD  Procedure: IV Infusion  IV Type: Peripheral, IV Location: L Antecubital  Avsola (infliximab-axxq), Dose: 1300 mg  Infusion Start Time: 0952  Infusion Stop Time: 1110  Post Infusion IV Care: Observation period completed  Discharge: Condition: Good, Destination: Home . AVS Provided  Performed by:  Cleotilde Neer, LPN

## 2023-08-08 ENCOUNTER — Encounter: Attending: Family Medicine

## 2023-08-08 VITALS — BP 116/76 | HR 71 | Temp 97.3°F | Resp 16 | Ht 75.0 in | Wt 295.0 lb

## 2023-08-08 DIAGNOSIS — K50113 Crohn's disease of large intestine with fistula: Secondary | ICD-10-CM | POA: Diagnosis not present

## 2023-08-08 MED ORDER — SODIUM CHLORIDE 0.9 % IV SOLN
1300.0000 mg | Freq: Once | INTRAVENOUS | Status: AC
  Administered 2023-08-08: 1300 mg via INTRAVENOUS
  Filled 2023-08-08: qty 130

## 2023-08-08 MED ORDER — ACETAMINOPHEN 325 MG PO TABS
650.0000 mg | ORAL_TABLET | Freq: Once | ORAL | Status: AC
  Administered 2023-08-08: 650 mg via ORAL

## 2023-08-08 MED ORDER — DIPHENHYDRAMINE HCL 25 MG PO CAPS
25.0000 mg | ORAL_CAPSULE | Freq: Once | ORAL | Status: AC
  Administered 2023-08-08: 25 mg via ORAL

## 2023-08-08 MED ORDER — METHYLPREDNISOLONE SODIUM SUCC 40 MG IJ SOLR
40.0000 mg | Freq: Once | INTRAMUSCULAR | Status: AC
  Administered 2023-08-08: 40 mg via INTRAVENOUS

## 2023-08-08 NOTE — Progress Notes (Signed)
 Diagnosis: Crohn's Disease  Provider:  Alvester Johnson MD  Procedure: IV Infusion  IV Type: Peripheral, IV Location: L Antecubital  Avsola  (infliximab -axxq), Dose: 1300mg   Infusion Start Time: 1000  Infusion Stop Time: 1115  Post Infusion IV Care: Peripheral IV Discontinued  Discharge: Condition: Good, Destination: Home . AVS Declined  Performed by:  Natividad Balding, RN

## 2023-09-05 ENCOUNTER — Ambulatory Visit

## 2023-09-10 ENCOUNTER — Ambulatory Visit

## 2023-09-12 ENCOUNTER — Encounter: Attending: Family Medicine | Admitting: Emergency Medicine

## 2023-09-12 VITALS — BP 118/70 | HR 58 | Temp 97.3°F | Resp 16

## 2023-09-12 DIAGNOSIS — K50113 Crohn's disease of large intestine with fistula: Secondary | ICD-10-CM | POA: Insufficient documentation

## 2023-09-12 MED ORDER — ACETAMINOPHEN 325 MG PO TABS
650.0000 mg | ORAL_TABLET | Freq: Once | ORAL | Status: AC
  Administered 2023-09-12: 650 mg via ORAL

## 2023-09-12 MED ORDER — SODIUM CHLORIDE 0.9 % IV SOLN
1300.0000 mg | Freq: Once | INTRAVENOUS | Status: AC
  Administered 2023-09-12: 1300 mg via INTRAVENOUS
  Filled 2023-09-12: qty 130

## 2023-09-12 MED ORDER — METHYLPREDNISOLONE SODIUM SUCC 40 MG IJ SOLR
40.0000 mg | Freq: Once | INTRAMUSCULAR | Status: AC
  Administered 2023-09-12: 40 mg via INTRAVENOUS

## 2023-09-12 MED ORDER — DIPHENHYDRAMINE HCL 25 MG PO CAPS
25.0000 mg | ORAL_CAPSULE | Freq: Once | ORAL | Status: AC
  Administered 2023-09-12: 25 mg via ORAL

## 2023-09-12 NOTE — Progress Notes (Signed)
 Diagnosis: Crohn's Disease  Provider:  Alvester Johnson MD  Procedure: IV Infusion  IV Type: Peripheral, IV Location: R Antecubital  Avsola  (infliximab -axxq), Dose: 1300mg   Infusion Start Time: 1006  Infusion Stop Time: 1115  Post Infusion IV Care: Peripheral IV Discontinued  Discharge: Condition: Good, Destination: Home . AVS Declined  Performed by:  Arlina Benjamin, RN

## 2023-10-17 ENCOUNTER — Encounter: Attending: Family Medicine | Admitting: Internal Medicine

## 2023-10-17 VITALS — BP 114/71 | HR 59 | Temp 98.2°F | Resp 14

## 2023-10-17 DIAGNOSIS — K50113 Crohn's disease of large intestine with fistula: Secondary | ICD-10-CM | POA: Diagnosis not present

## 2023-10-17 MED ORDER — SODIUM CHLORIDE 0.9 % IV SOLN
1300.0000 mg | Freq: Once | INTRAVENOUS | Status: AC
  Administered 2023-10-17: 1300 mg via INTRAVENOUS
  Filled 2023-10-17: qty 130

## 2023-10-17 MED ORDER — ACETAMINOPHEN 325 MG PO TABS
650.0000 mg | ORAL_TABLET | Freq: Once | ORAL | Status: AC
  Administered 2023-10-17: 650 mg via ORAL

## 2023-10-17 MED ORDER — METHYLPREDNISOLONE SODIUM SUCC 40 MG IJ SOLR
40.0000 mg | Freq: Once | INTRAMUSCULAR | Status: AC
  Administered 2023-10-17: 40 mg via INTRAVENOUS

## 2023-10-17 MED ORDER — DIPHENHYDRAMINE HCL 25 MG PO CAPS
25.0000 mg | ORAL_CAPSULE | Freq: Once | ORAL | Status: AC
  Administered 2023-10-17: 25 mg via ORAL

## 2023-10-17 NOTE — Progress Notes (Signed)
 Diagnosis: Crohn's Disease  Provider:  Leigh Standing MD  Procedure: IV Infusion  IV Type: Peripheral, IV Location: R Antecubital  Avsola  (infliximab -axxq), Dose: 1300 mg  Infusion Start Time: 1005  Infusion Stop Time: 1112  Post Infusion IV Care: Observation period completed  Discharge: Condition: Good, Destination: Home . AVS Provided  Performed by:  Blanca Selinda SAUNDERS, LPN

## 2023-11-14 ENCOUNTER — Encounter: Attending: Family Medicine | Admitting: Internal Medicine

## 2023-11-14 VITALS — BP 108/73 | HR 74 | Temp 98.0°F | Resp 14

## 2023-11-14 DIAGNOSIS — K50113 Crohn's disease of large intestine with fistula: Secondary | ICD-10-CM | POA: Diagnosis not present

## 2023-11-14 MED ORDER — METHYLPREDNISOLONE SODIUM SUCC 40 MG IJ SOLR
40.0000 mg | Freq: Once | INTRAMUSCULAR | Status: AC
Start: 2023-11-14 — End: 2023-11-14
  Administered 2023-11-14: 40 mg via INTRAVENOUS

## 2023-11-14 MED ORDER — ACETAMINOPHEN 325 MG PO TABS
650.0000 mg | ORAL_TABLET | Freq: Once | ORAL | Status: AC
  Administered 2023-11-14: 650 mg via ORAL

## 2023-11-14 MED ORDER — DIPHENHYDRAMINE HCL 25 MG PO CAPS
25.0000 mg | ORAL_CAPSULE | Freq: Once | ORAL | Status: AC
Start: 2023-11-14 — End: 2023-11-14
  Administered 2023-11-14: 25 mg via ORAL

## 2023-11-14 MED ORDER — SODIUM CHLORIDE 0.9 % IV SOLN
1300.0000 mg | Freq: Once | INTRAVENOUS | Status: AC
Start: 2023-11-14 — End: 2023-11-14
  Administered 2023-11-14: 1300 mg via INTRAVENOUS
  Filled 2023-11-14: qty 130

## 2023-11-14 NOTE — Progress Notes (Signed)
 Diagnosis: Crohn's Disease  Provider:  Leigh Standing MD  Procedure: IV Infusion  IV Type: Peripheral, IV Location: L Antecubital  Avsola  (infliximab -axxq), Dose: 1300mg   Infusion Start Time: 1005  Infusion Stop Time: 1126  Post Infusion IV Care: Observation period completed  Discharge: Condition: Good, Destination: Home . AVS Provided  Performed by:  Blanca Selinda SAUNDERS, LPN

## 2023-12-12 ENCOUNTER — Encounter: Attending: Family Medicine | Admitting: Emergency Medicine

## 2023-12-12 VITALS — BP 113/74 | HR 66 | Temp 97.7°F | Resp 15

## 2023-12-12 DIAGNOSIS — K50113 Crohn's disease of large intestine with fistula: Secondary | ICD-10-CM | POA: Diagnosis not present

## 2023-12-12 MED ORDER — DIPHENHYDRAMINE HCL 25 MG PO CAPS
25.0000 mg | ORAL_CAPSULE | Freq: Once | ORAL | Status: AC
  Administered 2023-12-12: 25 mg via ORAL

## 2023-12-12 MED ORDER — SODIUM CHLORIDE 0.9 % IV SOLN
1300.0000 mg | Freq: Once | INTRAVENOUS | Status: AC
  Administered 2023-12-12: 1300 mg via INTRAVENOUS
  Filled 2023-12-12: qty 130

## 2023-12-12 MED ORDER — METHYLPREDNISOLONE SODIUM SUCC 40 MG IJ SOLR
40.0000 mg | Freq: Once | INTRAMUSCULAR | Status: AC
  Administered 2023-12-12: 40 mg via INTRAVENOUS

## 2023-12-12 MED ORDER — ACETAMINOPHEN 325 MG PO TABS
650.0000 mg | ORAL_TABLET | Freq: Once | ORAL | Status: AC
  Administered 2023-12-12: 650 mg via ORAL

## 2023-12-12 NOTE — Progress Notes (Signed)
 Diagnosis: Crohn's Disease  Provider:  Leigh Standing MD  Procedure: IV Infusion  IV Type: Peripheral, IV Location: L Antecubital  Avsola  (infliximab -axxq), Dose: 1300 mg  Infusion Start Time: 1002  Infusion Stop Time: 1106  Post Infusion IV Care: Peripheral IV Discontinued  Discharge: Condition: Good, Destination: Home . AVS Provided  Performed by:  Delon ONEIDA Officer, RN

## 2024-01-08 ENCOUNTER — Ambulatory Visit

## 2024-01-15 ENCOUNTER — Ambulatory Visit

## 2024-01-30 ENCOUNTER — Encounter: Attending: Family Medicine | Admitting: Internal Medicine

## 2024-01-30 ENCOUNTER — Telehealth: Payer: Self-pay

## 2024-01-30 VITALS — BP 109/72 | HR 69 | Temp 97.9°F | Resp 156 | Wt 295.0 lb

## 2024-01-30 DIAGNOSIS — K50113 Crohn's disease of large intestine with fistula: Secondary | ICD-10-CM | POA: Insufficient documentation

## 2024-01-30 MED ORDER — DIPHENHYDRAMINE HCL 25 MG PO CAPS
25.0000 mg | ORAL_CAPSULE | Freq: Once | ORAL | Status: AC
  Administered 2024-01-30: 25 mg via ORAL

## 2024-01-30 MED ORDER — ACETAMINOPHEN 325 MG PO TABS
650.0000 mg | ORAL_TABLET | Freq: Once | ORAL | Status: AC
  Administered 2024-01-30: 650 mg via ORAL

## 2024-01-30 MED ORDER — METHYLPREDNISOLONE SODIUM SUCC 40 MG IJ SOLR
40.0000 mg | Freq: Once | INTRAMUSCULAR | Status: AC
  Administered 2024-01-30: 40 mg via INTRAVENOUS

## 2024-01-30 MED ORDER — SODIUM CHLORIDE 0.9 % IV SOLN
1300.0000 mg | Freq: Once | INTRAVENOUS | Status: AC
  Administered 2024-01-30: 1300 mg via INTRAVENOUS
  Filled 2024-01-30: qty 130

## 2024-01-30 NOTE — Progress Notes (Signed)
 Diagnosis: Crohn's Disease  Provider:  Leigh Standing MD  Procedure: IV Infusion  IV Type: Peripheral, IV Location: R Antecubital  Avsola  (infliximab -axxq), Dose: 1300mg   Infusion Start Time: 0951  Infusion Stop Time: 1055  Post Infusion IV Care: Observation period completed  Discharge: Condition: Good, Destination: Home . AVS Provided  Performed by:  Blanca Selinda SAUNDERS, LPN

## 2024-01-30 NOTE — Telephone Encounter (Signed)
 I was notified that Avsola  DOS 11/14/23 and 12/12/23 was denied for no auth on file. Previous auth expired on 11/12/23. I submitted a new prior auth today to start for todays DOS, 01/30/24. I will also put in for a retro auth. Will follow up here once a decision is made.

## 2024-02-03 NOTE — Telephone Encounter (Signed)
 Auth Submission: APPROVED Site of care: Site of care: [facility] Payer: [employer] commercial Medication & CPT/J Code(s) submitted: Avsola  (infliximab -axxq) [id number] Diagnosis Code:  Route of submission (phone, fax, portal): portal Phone # Fax # Auth type: Buy/Bill PB Units/visits requested: 1300mg  x 12 doses Reference number: [id number] Approval from: 01/30/24 to 01/28/25

## 2024-02-27 NOTE — Telephone Encounter (Addendum)
 Auth Submission: APPROVED Site of care: Site of care: [facility] Payer: [employer] commercial Medication & CPT/J Code(s) submitted: Avsola  (infliximab -axxq) [id number] Diagnosis Code:  Route of submission (phone, fax, portal): portal Phone # Fax # Auth type: Buy/Bill PB Units/visits requested: 10mg /kg every 4 weeks Reference number: [id number] Approval from: 02/26/24 to 01/28/25  Approval letter is in the media tab  Retro auths are not available for outpatient services with [facility]

## 2024-02-28 ENCOUNTER — Ambulatory Visit

## 2024-03-03 ENCOUNTER — Encounter: Attending: Family Medicine | Admitting: Internal Medicine

## 2024-03-03 VITALS — BP 118/76 | HR 63 | Temp 98.3°F | Resp 16

## 2024-03-03 DIAGNOSIS — K50113 Crohn's disease of large intestine with fistula: Secondary | ICD-10-CM | POA: Insufficient documentation

## 2024-03-03 MED ORDER — DIPHENHYDRAMINE HCL 25 MG PO CAPS
25.0000 mg | ORAL_CAPSULE | Freq: Once | ORAL | Status: AC
  Administered 2024-03-03: 25 mg via ORAL

## 2024-03-03 MED ORDER — METHYLPREDNISOLONE SODIUM SUCC 40 MG IJ SOLR
40.0000 mg | Freq: Once | INTRAMUSCULAR | Status: AC
  Administered 2024-03-03: 40 mg via INTRAVENOUS

## 2024-03-03 MED ORDER — ACETAMINOPHEN 325 MG PO TABS
650.0000 mg | ORAL_TABLET | Freq: Once | ORAL | Status: AC
  Administered 2024-03-03: 650 mg via ORAL

## 2024-03-03 MED ORDER — SODIUM CHLORIDE 0.9 % IV SOLN
1300.0000 mg | Freq: Once | INTRAVENOUS | Status: AC
  Administered 2024-03-03: 1300 mg via INTRAVENOUS
  Filled 2024-03-03: qty 130

## 2024-03-03 NOTE — Progress Notes (Signed)
 Diagnosis: , Crohn's Disease  Provider:  Leigh Standing MD  Procedure: IV Infusion  IV Type: Peripheral, IV Location: L Antecubital   Avsola  (infliximab -axxq), Dose: 1300 mg  Infusion Start Time: 1407  Infusion Stop Time: 1513  Post Infusion IV Care: Observation period completed  Discharge: Condition: Good, Destination: Home . AVS Provided  Performed by:  Blanca Selinda SAUNDERS, LPN

## 2024-03-31 ENCOUNTER — Ambulatory Visit

## 2024-04-09 ENCOUNTER — Telehealth: Payer: Self-pay

## 2024-04-09 NOTE — Telephone Encounter (Signed)
 Auth Submission: APPROVED Site of care: Site of care: [facility] Payer: [employer] Medication & CPT/J Code(s) submitted: Avsola  (infliximab -axxq) [id number] Diagnosis Code:  Route of submission (phone, fax, portal): portal Phone # Fax # Auth type: Buy/Bill PB Units/visits requested: 10mg /kg q4weeks Reference number: [id number] Approval from: 04/07/24 to 04/06/2025

## 2024-04-10 ENCOUNTER — Encounter: Attending: Family Medicine | Admitting: Emergency Medicine

## 2024-04-10 VITALS — BP 108/71 | HR 68 | Temp 97.4°F | Resp 17 | Wt 295.0 lb

## 2024-04-10 DIAGNOSIS — K50113 Crohn's disease of large intestine with fistula: Secondary | ICD-10-CM | POA: Insufficient documentation

## 2024-04-10 MED ORDER — METHYLPREDNISOLONE SODIUM SUCC 40 MG IJ SOLR: 40.0000 mg | Freq: Once | INTRAMUSCULAR | Status: DC

## 2024-04-10 MED ORDER — SODIUM CHLORIDE 0.9 % IV SOLN
1300.0000 mg | Freq: Once | INTRAVENOUS | Status: DC
  Filled 2024-04-10: qty 130

## 2024-04-10 MED ORDER — ACETAMINOPHEN 325 MG PO TABS: 650.0000 mg | ORAL_TABLET | Freq: Once | ORAL | Status: DC

## 2024-04-10 MED ORDER — METHYLPREDNISOLONE SODIUM SUCC 40 MG IJ SOLR
40.0000 mg | Freq: Once | INTRAMUSCULAR | Status: AC
  Administered 2024-04-10: 40 mg via INTRAVENOUS

## 2024-04-10 MED ORDER — DIPHENHYDRAMINE HCL 25 MG PO CAPS: 25.0000 mg | ORAL_CAPSULE | Freq: Once | ORAL | Status: DC

## 2024-04-10 MED ORDER — ACETAMINOPHEN 325 MG PO TABS
650.0000 mg | ORAL_TABLET | Freq: Once | ORAL | Status: AC
  Administered 2024-04-10: 650 mg via ORAL

## 2024-04-10 MED ORDER — SODIUM CHLORIDE 0.9 % IV SOLN
10.0000 mg/kg | Freq: Once | INTRAVENOUS | Status: AC
  Administered 2024-04-10: 1300 mg via INTRAVENOUS
  Filled 2024-04-10: qty 130

## 2024-04-10 MED ORDER — DIPHENHYDRAMINE HCL 25 MG PO CAPS
25.0000 mg | ORAL_CAPSULE | Freq: Once | ORAL | Status: AC
  Administered 2024-04-10: 25 mg via ORAL

## 2024-04-10 NOTE — Progress Notes (Signed)
 Diagnosis: Crohn's Disease  Provider:  Leigh Standing MD  Procedure: IV Infusion  IV Type: Peripheral, IV Location: L Antecubital  Avsola  (infliximab -axxq), Dose: 1300 mg  Infusion Start Time: 1130  Infusion Stop Time: 1237  Post Infusion IV Care: Peripheral IV Discontinued  Discharge: Condition: Good, Destination: Home . AVS Declined  Performed by:  Delon ONEIDA Officer, RN

## 2024-05-08 ENCOUNTER — Ambulatory Visit

## 2024-05-18 ENCOUNTER — Ambulatory Visit
